# Patient Record
Sex: Male | Born: 1955 | Race: White | Hispanic: No | Marital: Married | Smoking: Current every day smoker
Health system: Southern US, Community
[De-identification: ages and names within clinical notes are randomized; demographics above are authoritative.]

## PROBLEM LIST (undated history)

## (undated) DIAGNOSIS — N200 Calculus of kidney: Secondary | ICD-10-CM

## (undated) DIAGNOSIS — M4802 Spinal stenosis, cervical region: Secondary | ICD-10-CM

## (undated) DIAGNOSIS — G629 Polyneuropathy, unspecified: Secondary | ICD-10-CM

## (undated) DIAGNOSIS — M5412 Radiculopathy, cervical region: Secondary | ICD-10-CM

## (undated) HISTORY — DX: Polyneuropathy, unspecified: G62.9

## (undated) HISTORY — PX: BACK SURGERY: SHX140

## (undated) HISTORY — DX: Radiculopathy, cervical region: M54.12

## (undated) HISTORY — DX: Calculus of kidney: N20.0

## (undated) HISTORY — DX: Spinal stenosis, cervical region: M48.02

---

## 2000-05-06 HISTORY — PX: CYSTOSCOPY/URETEROSCOPY/HOLMIUM LASER/STENT PLACEMENT: SHX6546

## 2004-12-23 ENCOUNTER — Emergency Department: Payer: Self-pay | Admitting: Unknown Physician Specialty

## 2005-01-04 ENCOUNTER — Emergency Department: Payer: Self-pay | Admitting: Internal Medicine

## 2005-02-12 ENCOUNTER — Other Ambulatory Visit: Payer: Self-pay

## 2005-02-13 ENCOUNTER — Inpatient Hospital Stay: Payer: Self-pay | Admitting: Infectious Diseases

## 2009-04-10 ENCOUNTER — Emergency Department: Payer: Self-pay | Admitting: Emergency Medicine

## 2009-09-10 ENCOUNTER — Emergency Department: Payer: Self-pay | Admitting: Emergency Medicine

## 2010-06-09 ENCOUNTER — Emergency Department: Payer: Self-pay | Admitting: Unknown Physician Specialty

## 2012-05-01 ENCOUNTER — Ambulatory Visit: Payer: Self-pay | Admitting: Family Medicine

## 2013-07-01 ENCOUNTER — Emergency Department: Payer: Self-pay | Admitting: Internal Medicine

## 2014-05-05 DIAGNOSIS — M5412 Radiculopathy, cervical region: Secondary | ICD-10-CM | POA: Insufficient documentation

## 2014-05-05 HISTORY — DX: Radiculopathy, cervical region: M54.12

## 2014-05-09 DIAGNOSIS — M4802 Spinal stenosis, cervical region: Secondary | ICD-10-CM

## 2014-05-09 HISTORY — DX: Spinal stenosis, cervical region: M48.02

## 2015-06-21 ENCOUNTER — Emergency Department: Payer: PRIVATE HEALTH INSURANCE

## 2015-06-21 ENCOUNTER — Emergency Department
Admission: EM | Admit: 2015-06-21 | Discharge: 2015-06-21 | Disposition: A | Discharge status: Home / Self Care | Payer: PRIVATE HEALTH INSURANCE | Attending: Emergency Medicine | Admitting: Emergency Medicine

## 2015-06-21 ENCOUNTER — Encounter: Payer: Self-pay | Admitting: Emergency Medicine

## 2015-06-21 DIAGNOSIS — R109 Unspecified abdominal pain: Secondary | ICD-10-CM | POA: Diagnosis present

## 2015-06-21 DIAGNOSIS — N200 Calculus of kidney: Secondary | ICD-10-CM | POA: Diagnosis not present

## 2015-06-21 DIAGNOSIS — F17210 Nicotine dependence, cigarettes, uncomplicated: Secondary | ICD-10-CM | POA: Diagnosis not present

## 2015-06-21 LAB — CBC
HCT: 42.6 % (ref 40.0–52.0)
HEMOGLOBIN: 14.7 g/dL (ref 13.0–18.0)
MCH: 30.5 pg (ref 26.0–34.0)
MCHC: 34.4 g/dL (ref 32.0–36.0)
MCV: 88.7 fL (ref 80.0–100.0)
Platelets: 161 10*3/uL (ref 150–440)
RBC: 4.8 MIL/uL (ref 4.40–5.90)
RDW: 13.6 % (ref 11.5–14.5)
WBC: 8.6 10*3/uL (ref 3.8–10.6)

## 2015-06-21 LAB — BASIC METABOLIC PANEL
ANION GAP: 6 (ref 5–15)
BUN: 22 mg/dL — ABNORMAL HIGH (ref 6–20)
CALCIUM: 9 mg/dL (ref 8.9–10.3)
CHLORIDE: 109 mmol/L (ref 101–111)
CO2: 29 mmol/L (ref 22–32)
Creatinine, Ser: 1.4 mg/dL — ABNORMAL HIGH (ref 0.61–1.24)
GFR calc non Af Amer: 54 mL/min — ABNORMAL LOW (ref >60)
Glucose, Bld: 84 mg/dL (ref 65–99)
Potassium: 4.4 mmol/L (ref 3.5–5.1)
Sodium: 144 mmol/L (ref 135–145)

## 2015-06-21 LAB — URINALYSIS COMPLETE WITH MICROSCOPIC (ARMC ONLY)
Bilirubin Urine: NEGATIVE
Glucose, UA: NEGATIVE mg/dL
Ketones, ur: NEGATIVE mg/dL
Leukocytes, UA: NEGATIVE
Nitrite: NEGATIVE
PH: 6 (ref 5.0–8.0)
Protein, ur: NEGATIVE mg/dL
Specific Gravity, Urine: 1.023 (ref 1.005–1.030)
Squamous Epithelial / LPF: NONE SEEN

## 2015-06-21 MED ORDER — OXYCODONE-ACETAMINOPHEN 5-325 MG PO TABS: 1.0000 | ORAL_TABLET | Freq: Four times a day (QID) | ORAL | Status: DC | PRN

## 2015-06-21 MED ORDER — ONDANSETRON HCL 4 MG PO TABS: 4.0000 mg | ORAL_TABLET | Freq: Every day | ORAL | Status: DC | PRN

## 2015-06-21 MED ORDER — ONDANSETRON HCL 4 MG/2ML IJ SOLN
4.0000 mg | Freq: Once | INTRAMUSCULAR | Status: AC
  Administered 2015-06-21: 4 mg via INTRAVENOUS
  Filled 2015-06-21: qty 2

## 2015-06-21 MED ORDER — KETOROLAC TROMETHAMINE 30 MG/ML IJ SOLN
INTRAMUSCULAR | Status: AC
Start: 2015-06-21 — End: 2015-06-21
  Administered 2015-06-21: 15 mg via INTRAVENOUS
  Filled 2015-06-21: qty 1

## 2015-06-21 MED ORDER — KETOROLAC TROMETHAMINE 30 MG/ML IJ SOLN
15.0000 mg | Freq: Once | INTRAMUSCULAR | Status: AC
  Administered 2015-06-21: 15 mg via INTRAVENOUS

## 2015-06-21 MED ORDER — TAMSULOSIN HCL 0.4 MG PO CAPS: 0.4000 mg | ORAL_CAPSULE | Freq: Every day | ORAL | Status: DC

## 2015-06-21 MED ORDER — OXYCODONE-ACETAMINOPHEN 5-325 MG PO TABS
1.0000 | ORAL_TABLET | Freq: Once | ORAL | Status: DC
  Filled 2015-06-21: qty 1

## 2015-06-21 MED ORDER — SODIUM CHLORIDE 0.9 % IV BOLUS (SEPSIS)
500.0000 mL | Freq: Once | INTRAVENOUS | Status: AC
  Administered 2015-06-21: 500 mL via INTRAVENOUS

## 2015-06-21 NOTE — ED Notes (Signed)
Patient presents to the ED with right flank pain x 3-4 days.  Patient states pain has been increasing over the past several days.  Patient denies dysuria.  Patient states pain is constant and feels like being punched.  Patient states nothing makes it worse or better.  Patient reports nausea but denies vomiting and denies diarrhea.  Patient reports history of kidney stones but states this feels different.  Patient reports severe pain, sitting comfortable relaxed in triage, speaking in full sentences, no obvious distress.  Ambulatory to triage.

## 2015-06-21 NOTE — ED Provider Notes (Addendum)
[facility] Emergency Department Provider Note  ____________________________________________   I have reviewed the triage vital signs and the nursing notes.   HISTORY  Chief Complaint Flank Pain    HPI Martin [name] is a 60 y.o. male with a history of kidney stones last passedwas 7 years ago, history of back surgery otherwise largely healthy gentleman presents today complaining of right flank pain which was gradual in onset of 3-4 days duration. No dysuria no hematuria. Patient states this is not like his prior kidney stones as the pain is any different spot hand feels somewhat different. He has no abdominal pain. He has not vomited. No fever. Nothing makes it better nothing makes it worse. Denies hematuria. No diarrhea or other GI complaints. No chest pain or shortness of breath  History reviewed. No pertinent past medical history.  There are no active problems to display for this patient.   Past Surgical History  Procedure Laterality Date  . Back surgery      No current outpatient prescriptions on file.  Allergies Review of patient's allergies indicates no known allergies.  No family history on file.  Social History Social History  Substance Use Topics  . Smoking status: Current Every Day Smoker -- 0.30 packs/day    Types: Cigarettes  . Smokeless tobacco: None  . Alcohol Use: Yes     Comment: rarely    Review of Systems Constitutional: No fever/chills Eyes: No visual changes. ENT: No sore throat. No stiff neck no neck pain Cardiovascular: Denies chest pain. Respiratory: Denies shortness of breath. Gastrointestinal:   no vomiting.  No diarrhea.  No constipation. Genitourinary: Negative for dysuria. Musculoskeletal: Negative lower extremity swelling Skin: Negative for rash. Neurological: Negative for headaches, focal weakness or numbness. 10-point ROS otherwise  negative.  ____________________________________________   PHYSICAL EXAM:  VITAL SIGNS: ED Triage Vitals  Enc Vitals Group     BP 06/21/15 1300 142/78 mmHg     Pulse Rate 06/21/15 1300 54     Resp 06/21/15 1300 18     Temp 06/21/15 1300 97.8 F (36.6 C)     Temp Source 06/21/15 1300 Oral     SpO2 06/21/15 1300 98 %     Weight 06/21/15 1300 195 lb (88.451 kg)     Height 06/21/15 1300 5\' 9"  (1.753 m)     Head Cir --      Peak Flow --      Pain Score 06/21/15 1301 10     Pain Loc --      Pain Edu? --      Excl. in [place]? --     Constitutional: Alert and oriented. Well appearing and in no acute distress. Eyes: Conjunctivae are normal. PERRL. EOMI. Head: Atraumatic. Nose: No congestion/rhinnorhea. Mouth/Throat: Mucous membranes are moist.  Oropharynx non-erythematous. Neck: No stridor.   Nontender with no meningismus Cardiovascular: Normal rate, regular rhythm. Grossly normal heart sounds.  Good peripheral circulation. Respiratory: Normal respiratory effort.  No retractions. Lungs CTAB. Abdominal: Soft and nontender. No distention. No guarding no rebound Back:  There is no focal tenderness or step off there is no midline tenderness there are no lesions noted. there is right CVA tenderness Musculoskeletal: No lower extremity tenderness. No joint effusions, no DVT signs strong distal pulses no edema Neurologic:  Normal speech and language. No gross focal neurologic deficits are appreciated.  Skin:  Skin is warm, dry and intact. No rash noted. Psychiatric: Mood and affect are normal. Speech and behavior are  normal.  ____________________________________________   LABS (all labs ordered are listed, but only abnormal results are displayed)  Labs Reviewed  BASIC METABOLIC PANEL - Abnormal; Notable for the following:    BUN 22 (*)    Creatinine, Ser 1.40 (*)    GFR calc non Af Amer 54 (*)    All other components within normal limits  URINALYSIS COMPLETEWITH MICROSCOPIC ([facility]  ONLY) - Abnormal; Notable for the following:    Color, Urine YELLOW (*)    APPearance CLEAR (*)    Hgb urine dipstick 2+ (*)    Bacteria, UA RARE (*)    All other components within normal limits  CBC   ____________________________________________  EKG  I personally interpreted any EKGs ordered by me or triage  ____________________________________________  RADIOLOGY  I reviewed any imaging ordered by me or triage that were performed during my shift ____________________________________________   PROCEDURES  Procedure(s) performed: None  Critical Care performed: None  ____________________________________________   INITIAL IMPRESSION / ASSESSMENT AND PLAN / ED COURSE  Pertinent labs & imaging results that were available during my care of the patient were reviewed by me and considered in my medical decision making (see chart for details).  Patient with right flank pain and hematuria most likely he has a kidney stone. However, given his age, somewhat elevated creatinine we will give him IV fluid and obtain a CT scan to rule out other entities such as AAA which I have very low suspicion.  ----------------------------------------- 4:08 PM on 06/21/2015 -----------------------------------------  There is an 8 mm kidney stone which is likely the cause of the patient's pain. We will talk to urology given the size of the stone he may have to have it broken up. ____________________________________________   FINAL CLINICAL IMPRESSION(S) / ED DIAGNOSES  Final diagnoses:  Flank pain      This chart was dictated using voice recognition software.  Despite best efforts to proofread,  errors can occur which can change meaning.    Schuyler Amor, MD 06/21/15 [place], MD 06/21/15 [phone]

## 2015-06-21 NOTE — Discharge Instructions (Signed)
You have a very large stone, it is 8 mm. It likely will not pass on its own. You must follow closely with urology. If you've suffer from a fever or pain that is out of control return to the emergency department.

## 2015-06-22 ENCOUNTER — Encounter: Payer: Self-pay | Admitting: *Deleted

## 2015-06-22 ENCOUNTER — Encounter: Payer: Self-pay | Admitting: Urology

## 2015-06-22 ENCOUNTER — Ambulatory Visit (INDEPENDENT_AMBULATORY_CARE_PROVIDER_SITE_OTHER): Payer: PRIVATE HEALTH INSURANCE | Admitting: Urology

## 2015-06-22 VITALS — BP 161/85 | HR 65 | Ht 69.0 in | Wt 198.0 lb

## 2015-06-22 DIAGNOSIS — D3502 Benign neoplasm of left adrenal gland: Secondary | ICD-10-CM | POA: Diagnosis not present

## 2015-06-22 DIAGNOSIS — N179 Acute kidney failure, unspecified: Secondary | ICD-10-CM | POA: Diagnosis not present

## 2015-06-22 DIAGNOSIS — N201 Calculus of ureter: Secondary | ICD-10-CM

## 2015-06-22 LAB — URINALYSIS, COMPLETE
BILIRUBIN UA: NEGATIVE
Glucose, UA: NEGATIVE
KETONES UA: NEGATIVE
LEUKOCYTES UA: NEGATIVE
Nitrite, UA: NEGATIVE
SPEC GRAV UA: 1.02 (ref 1.005–1.030)
Urobilinogen, Ur: 1 mg/dL (ref 0.2–1.0)
pH, UA: 6.5 (ref 5.0–7.5)

## 2015-06-22 LAB — MICROSCOPIC EXAMINATION
Bacteria, UA: NONE SEEN
Epithelial Cells (non renal): NONE SEEN /hpf (ref 0–10)
RBC, UA: >30 /hpf — ABNORMAL HIGH (ref 0–?)
WBC, UA: NONE SEEN /hpf (ref 0–?)

## 2015-06-22 MED ORDER — OXYCODONE-ACETAMINOPHEN 5-325 MG PO TABS: 1.0000 | ORAL_TABLET | ORAL | Status: DC | PRN

## 2015-06-22 MED ORDER — OXYCODONE-ACETAMINOPHEN 5-325 MG PO TABS: 1.0000 | ORAL_TABLET | Freq: Four times a day (QID) | ORAL | Status: DC | PRN

## 2015-06-22 NOTE — Progress Notes (Signed)
06/22/2015 11:02 AM   Martin [name] 02/21/1956 [id number]  Referring provider: ED   Chief Complaint  Patient presents with  . Nephrolithiasis    New Patient    HPI: 60 yo M who  Presents today for follow up from the ED yesterday found to have 8 mm right mid ureteral calculus with moderate right hydronephrosis.    He has right flank pain 2-3x.  No nausea or vomiting.  No dysuria or gross hematuria.  No fevers or chills.    He has had previous episodes of kidney stones which required surgical intervention (laser lithotripsy with ureteral stent) ~15 year ago.  Stone composition unknown.   725 HFU, 11 mm SDS distance.  Pain well controlled on oral pain meds.     PMH: Past Medical History  Diagnosis Date  . Neuropathy ([place])   . Kidney stone   . 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 Cervical spinal stenosis 05/09/2014    Surgical History: Past Surgical History  Procedure Laterality Date  . Back surgery      1994, 1996   . Cystoscopy/ureteroscopy/holmium laser/stent placement  2002    Home Medications:    Medication List       This list is accurate as of: 06/22/15 11:02 AM.  Always use your most recent med list.               gabapentin 300 MG capsule  Commonly known as:  NEURONTIN  Take 600 mg by mouth at bedtime.     ondansetron 4 MG tablet  Commonly known as:  ZOFRAN  Take 1 tablet (4 mg total) by mouth daily as needed for nausea or vomiting.     oxyCODONE-acetaminophen 5-325 MG tablet  Commonly known as:  ROXICET  Take 1 tablet by mouth every 6 (six) hours as needed.     tamsulosin 0.4 MG Caps capsule  Commonly known as:  FLOMAX  Take 1 capsule (0.4 mg total) by mouth daily.        Allergies: No Known Allergies  Family History: Family History  Problem Relation Age of Onset  . Prostate cancer Neg Hx   . Bladder Cancer Neg Hx   . Kidney cancer Neg Hx     Social History:  reports that he has been smoking Cigarettes.  He has been  smoking about 0.50 packs per day. He does not have any smokeless tobacco history on file. He reports that he drinks alcohol. He reports that he does not use illicit drugs.  ROS: UROLOGY Frequent Urination?: No Hard to postpone urination?: No Burning/pain with urination?: No Get up at night to urinate?: No Leakage of urine?: No Urine stream starts and stops?: No Trouble starting stream?: No Do you have to strain to urinate?: No Blood in urine?: Yes Urinary tract infection?: No Sexually transmitted disease?: No Injury to kidneys or bladder?: No Painful intercourse?: No Weak stream?: No Erection problems?: No Penile pain?: No  Gastrointestinal Nausea?: No Vomiting?: No Indigestion/heartburn?: No Diarrhea?: No Constipation?: No  Constitutional Fever: No Night sweats?: No Weight loss?: No Fatigue?: No  Skin Skin rash/lesions?: No Itching?: No  Eyes Blurred vision?: No Double vision?: No  Ears/Nose/Throat Sore throat?: No Sinus problems?: No  Hematologic/Lymphatic Swollen glands?: No Easy bruising?: No  Cardiovascular Leg swelling?: No Chest pain?: No  Respiratory Cough?: No Shortness of breath?: No  Endocrine Excessive thirst?: No  Musculoskeletal Back pain?: No Joint pain?: No  Neurological Headaches?: No Dizziness?: No  Psychologic Depression?:  No Anxiety?: No  Physical Exam: BP 161/85 mmHg  Pulse 65  Ht '5\' 9"'  (1.753 m)  Wt 198 lb (89.812 kg)  BMI 29.23 kg/m2  Constitutional:  Alert and oriented, No acute distress. HEENT: [place] AT, moist mucus membranes.  Trachea midline, no masses. Cardiovascular: No clubbing, cyanosis, or edema. Respiratory: Normal respiratory effort, no increased work of breathing. GI: Abdomen is soft, nontender, nondistended, no abdominal masses GU: No CVA tenderness.  Skin: No rashes, bruises or suspicious lesions. Lymph: No cervical or inguinal adenopathy. Neurologic: Grossly intact, no focal deficits, moving all  4 extremities. Psychiatric: Normal mood and affect.  Laboratory Data: Lab Results  Component Value Date   WBC 8.6 06/21/2015   HGB 14.7 06/21/2015   HCT 42.6 06/21/2015   MCV 88.7 06/21/2015   PLT 161 06/21/2015    Lab Results  Component Value Date   CREATININE 1.40* 06/21/2015    Urinalysis    Component Value Date/Time   COLORURINE YELLOW* 06/21/2015 1304   APPEARANCEUR CLEAR* 06/21/2015 1304   LABSPEC 1.023 06/21/2015 1304   PHURINE 6.0 06/21/2015 1304   GLUCOSEU NEGATIVE 06/21/2015 1304   HGBUR 2+* 06/21/2015 1304   BILIRUBINUR NEGATIVE 06/21/2015 1304   KETONESUR NEGATIVE 06/21/2015 1304   PROTEINUR NEGATIVE 06/21/2015 1304   NITRITE NEGATIVE 06/21/2015 1304   LEUKOCYTESUR NEGATIVE 06/21/2015 1304    Pertinent Imaging: Study Result     CLINICAL DATA: Patient presents to the ED with right flank pain x 3-4 days. Patient states pain has been increasing over the past several days. Patient denies dysuria. Patient states pain is constant and feels like being punched. Patient states nothing makes it worse or better. Patient reports nausea but denies vomiting and denies diarrhea. Patient reports history of kidney stones but states this feels different. right flank pain  EXAM: CT ABDOMEN AND PELVIS WITHOUT CONTRAST  TECHNIQUE: Multidetector CT imaging of the abdomen and pelvis was performed following the standard protocol without IV contrast.  COMPARISON: 12/23/2004  FINDINGS: Lung bases: Clear. Heart normal size.  Liver: Fatty infiltration. No mass or focal lesion.  Gallbladder and biliary tree: Small dependent gallstone. Gallbladder otherwise unremarkable. No bile duct dilation.  Spleen, pancreas: Normal.  Adrenal glands: 17 mm left adrenal mass, without change from the prior exam consistent with an adenoma. Normal right adrenal gland.  Kidneys, ureters, bladder: Moderate right hydronephrosis. The proximal ureter is dilated to where it  intersects an 8 mm stone, between the proximal and mid right ureter. Ureter below this is normal in caliber. Right kidney is mildly edematous with mild perinephric stranding. Left renal collecting system ureter are normal. No renal masses. Bladder is decompressed.  Lymph nodes: No adenopathy.  Ascites: None.  Gastrointestinal: Numerous colonic diverticula mostly along the sigmoid colon. No diverticulitis. Stomach and small bowel are unremarkable. Normal appendix visualized.  Musculoskeletal: Degenerative changes are noted most evident at L5-S1. No osteoblastic or osteolytic lesions.  IMPRESSION: 1. 8 mm stone in the proximal to mid right ureter causes moderate right hydronephrosis and mild right renal edema and perinephric stranding. No intrarenal stones. No other ureteral stones. 2. No other acute findings. 3. Hepatic steatosis. Left adrenal adenoma. Colonic diverticulosis.   Electronically Signed  By: Lajean Manes M.D.  On: 06/21/2015 15:43      Assessment & Plan:   1. Right ureteral stone 72m prox/ mid ureteral stone.  No evidence of infection.  Discussed MET vs. Surgical intervention.  Given the size of the stone, he does have a chance of  passing the stone spontaneously, although relatively low.  We discussed various treatment options including ESWL vs. ureteroscopy, laser lithotripsy, and stent. We discussed the risks and benefits of both including bleeding, infection, damage to surrounding structures, efficacy with need for possible further intervention, and need for temporary ureteral stent.  After considering his options, he like to proceed with right ESWL.  Preprocedure instructions reviewed and consent signed today.  - Urinalysis, Complete  2. Acute kidney injury ([place]) Cr 1.4, baseline unknown Will recheck post op  3. 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

## 2015-06-24 LAB — CULTURE, URINE COMPREHENSIVE

## 2015-06-29 ENCOUNTER — Encounter
Admission: RE | Disposition: A | Discharge status: Home / Self Care | Payer: Self-pay | Source: Ambulatory Visit | Attending: Urology

## 2015-06-29 ENCOUNTER — Ambulatory Visit
Admission: RE | Admit: 2015-06-29 | Discharge: 2015-06-29 | Disposition: A | Discharge status: Home / Self Care | Payer: PRIVATE HEALTH INSURANCE | Source: Ambulatory Visit | Attending: Urology | Admitting: Urology

## 2015-06-29 ENCOUNTER — Encounter: Payer: Self-pay | Admitting: *Deleted

## 2015-06-29 ENCOUNTER — Ambulatory Visit: Payer: PRIVATE HEALTH INSURANCE

## 2015-06-29 DIAGNOSIS — N201 Calculus of ureter: Secondary | ICD-10-CM | POA: Insufficient documentation

## 2015-06-29 DIAGNOSIS — F17200 Nicotine dependence, unspecified, uncomplicated: Secondary | ICD-10-CM | POA: Diagnosis not present

## 2015-06-29 DIAGNOSIS — Z79899 Other long term (current) drug therapy: Secondary | ICD-10-CM | POA: Diagnosis not present

## 2015-06-29 DIAGNOSIS — N200 Calculus of kidney: Secondary | ICD-10-CM

## 2015-06-29 HISTORY — PX: EXTRACORPOREAL SHOCK WAVE LITHOTRIPSY: SHX1557

## 2015-06-29 SURGERY — LITHOTRIPSY, ESWL
Anesthesia: Moderate Sedation | Laterality: Right

## 2015-06-29 MED ORDER — DIAZEPAM 5 MG PO TABS
10.0000 mg | ORAL_TABLET | ORAL | Status: AC
  Administered 2015-06-29: 10 mg via ORAL

## 2015-06-29 MED ORDER — DIPHENHYDRAMINE HCL 25 MG PO CAPS
ORAL_CAPSULE | ORAL | Status: AC
  Filled 2015-06-29: qty 1

## 2015-06-29 MED ORDER — OXYCODONE-ACETAMINOPHEN 5-325 MG PO TABS
1.0000 | ORAL_TABLET | ORAL | Status: DC | PRN
  Administered 2015-06-29: 1 via ORAL

## 2015-06-29 MED ORDER — DIPHENHYDRAMINE HCL 25 MG PO CAPS
25.0000 mg | ORAL_CAPSULE | ORAL | Status: AC
  Administered 2015-06-29: 25 mg via ORAL

## 2015-06-29 MED ORDER — DIAZEPAM 5 MG PO TABS
ORAL_TABLET | ORAL | Status: AC
  Filled 2015-06-29: qty 2

## 2015-06-29 MED ORDER — CIPROFLOXACIN HCL 500 MG PO TABS
ORAL_TABLET | ORAL | Status: AC
  Filled 2015-06-29: qty 1

## 2015-06-29 MED ORDER — CIPROFLOXACIN HCL 500 MG PO TABS
500.0000 mg | ORAL_TABLET | Freq: Once | ORAL | Status: AC
  Administered 2015-06-29: 500 mg via ORAL

## 2015-06-29 MED ORDER — OXYCODONE-ACETAMINOPHEN 5-325 MG PO TABS
ORAL_TABLET | ORAL | Status: DC
Start: 2015-06-29 — End: 2015-06-29
  Filled 2015-06-29: qty 1

## 2015-06-29 MED ORDER — DEXTROSE-NACL 5-0.45 % IV SOLN
INTRAVENOUS | Status: DC
  Administered 2015-06-29: 13:00:00 via INTRAVENOUS

## 2015-06-29 NOTE — Discharge Instructions (Signed)

## 2015-06-30 ENCOUNTER — Other Ambulatory Visit: Payer: Self-pay | Admitting: Urology

## 2015-06-30 ENCOUNTER — Encounter: Payer: Self-pay | Admitting: Urology

## 2015-06-30 DIAGNOSIS — N200 Calculus of kidney: Secondary | ICD-10-CM

## 2015-07-07 ENCOUNTER — Encounter: Payer: Self-pay | Admitting: Urology

## 2015-07-07 ENCOUNTER — Ambulatory Visit (INDEPENDENT_AMBULATORY_CARE_PROVIDER_SITE_OTHER): Payer: PRIVATE HEALTH INSURANCE | Admitting: Urology

## 2015-07-07 ENCOUNTER — Ambulatory Visit
Admission: RE | Admit: 2015-07-07 | Discharge: 2015-07-07 | Disposition: A | Discharge status: Home / Self Care | Payer: PRIVATE HEALTH INSURANCE | Source: Ambulatory Visit | Attending: Urology | Admitting: Urology

## 2015-07-07 VITALS — BP 119/74 | HR 72 | Ht 69.0 in | Wt 196.2 lb

## 2015-07-07 DIAGNOSIS — N200 Calculus of kidney: Secondary | ICD-10-CM | POA: Diagnosis not present

## 2015-07-07 LAB — MICROSCOPIC EXAMINATION
Bacteria, UA: NONE SEEN
Epithelial Cells (non renal): NONE SEEN /hpf (ref 0–10)
WBC UA: NONE SEEN /HPF (ref 0–?)

## 2015-07-07 LAB — URINALYSIS, COMPLETE
BILIRUBIN UA: NEGATIVE
GLUCOSE, UA: NEGATIVE
KETONES UA: NEGATIVE
LEUKOCYTES UA: NEGATIVE
Nitrite, UA: NEGATIVE
PROTEIN UA: NEGATIVE
UUROB: 1 mg/dL (ref 0.2–1.0)
pH, UA: 5.5 (ref 5.0–7.5)

## 2015-07-07 NOTE — Progress Notes (Addendum)
07/07/2015 10:49 AM   Martin [name] 06/02/1955 [id number]  Referring provider: No referring provider defined for this encounter.  Chief Complaint  Patient presents with  . Follow-up    lithotripsy    HPI: 60 yo M who Presents today for follow up from the ED yesterday found to have 8 mm right mid ureteral calculus with moderate right hydronephrosis.   He has right flank pain 2-3x. No nausea or vomiting. No dysuria or gross hematuria. No fevers or chills.   He has had previous episodes of kidney stones which required surgical intervention (laser lithotripsy with ureteral stent) ~15 year ago. Stone composition unknown.   725 HFU, 11 mm SDS distance.  Pain well controlled on oral pain meds.   March 2017 interval history: The patient underwent ESWL of his right mid ureteral calculus. He has a negative KUB at this time. His right flank pain that he experienced preoperatively has completely resolved now. He's had no problems since surgery. He has not seen any large stone fragments in his urine.   PMH: Past Medical History  Diagnosis Date  . Neuropathy ([place])   . Kidney stone   . 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 Cervical spinal stenosis 05/09/2014    Surgical History: Past Surgical History  Procedure Laterality Date  . Back surgery      1994, 1996   . Cystoscopy/ureteroscopy/holmium laser/stent placement  2002  . Extracorporeal shock wave lithotripsy Right 06/29/2015    Procedure: EXTRACORPOREAL SHOCK WAVE LITHOTRIPSY (ESWL);  Surgeon: Nickie Retort, MD;  Location: [facility] ORS;  Service: Urology;  Laterality: Right;    Home Medications:    Medication List       This list is accurate as of: 07/07/15 10:49 AM.  Always use your most recent med list.               gabapentin 300 MG capsule  Commonly known as:  NEURONTIN  Take 600 mg by mouth at bedtime.     ondansetron 4 MG tablet  Commonly known as:  ZOFRAN  Take 1 tablet (4 mg total) by mouth  daily as needed for nausea or vomiting.     oxyCODONE-acetaminophen 5-325 MG tablet  Commonly known as:  PERCOCET  Take 1-2 tablets by mouth every 4 (four) hours as needed for moderate pain or severe pain.     oxyCODONE-acetaminophen 5-325 MG tablet  Commonly known as:  ROXICET  Take 1 tablet by mouth every 6 (six) hours as needed.     tamsulosin 0.4 MG Caps capsule  Commonly known as:  FLOMAX  Take 1 capsule (0.4 mg total) by mouth daily.        Allergies: No Known Allergies  Family History: Family History  Problem Relation Age of Onset  . Prostate cancer Neg Hx   . Bladder Cancer Neg Hx   . Kidney cancer Neg Hx     Social History:  reports that he has been smoking Cigarettes.  He has been smoking about 0.50 packs per day. He does not have any smokeless tobacco history on file. He reports that he drinks alcohol. He reports that he does not use illicit drugs.  ROS: UROLOGY Frequent Urination?: No Hard to postpone urination?: No Burning/pain with urination?: No Get up at night to urinate?: No Leakage of urine?: No Urine stream starts and stops?: No Trouble starting stream?: No Do you have to strain to urinate?: No Blood in urine?: No Urinary tract infection?: No Sexually transmitted  disease?: No Injury to kidneys or bladder?: No Painful intercourse?: No Weak stream?: No Erection problems?: No Penile pain?: No  Gastrointestinal Nausea?: No Vomiting?: No Indigestion/heartburn?: No Diarrhea?: No Constipation?: No  Constitutional Fever: No Night sweats?: No Fatigue?: No  Skin Skin rash/lesions?: No Itching?: No  Eyes Blurred vision?: No Double vision?: No  Ears/Nose/Throat Sore throat?: No Sinus problems?: No  Hematologic/Lymphatic Swollen glands?: No Easy bruising?: No  Cardiovascular Leg swelling?: No Chest pain?: No  Respiratory Cough?: No Shortness of breath?: No  Endocrine Excessive thirst?: No  Musculoskeletal Back pain?:  No Joint pain?: No  Neurological Headaches?: No Dizziness?: No  Psychologic Depression?: No Anxiety?: No  Physical Exam: BP 119/74 mmHg  Pulse 72  Ht 5\' 9"  (1.753 m)  Wt 196 lb 3.2 oz (88.996 kg)  BMI 28.96 kg/m2  Constitutional:  Alert and oriented, No acute distress. HEENT: [place] AT, moist mucus membranes.  Trachea midline, no masses. Cardiovascular: No clubbing, cyanosis, or edema. Respiratory: Normal respiratory effort, no increased work of breathing. GI: Abdomen is soft, nontender, nondistended, no abdominal masses GU: No CVA tenderness.  Skin: No rashes, bruises or suspicious lesions. Lymph: No cervical or inguinal adenopathy. Neurologic: Grossly intact, no focal deficits, moving all 4 extremities. Psychiatric: Normal mood and affect.  Laboratory Data: Lab Results  Component Value Date   WBC 8.6 06/21/2015   HGB 14.7 06/21/2015   HCT 42.6 06/21/2015   MCV 88.7 06/21/2015   PLT 161 06/21/2015    Lab Results  Component Value Date   CREATININE 1.40* 06/21/2015    No results found for: PSA  No results found for: TESTOSTERONE  No results found for: HGBA1C  Urinalysis    Component Value Date/Time   COLORURINE YELLOW* 06/21/2015 1304   APPEARANCEUR CLEAR* 06/21/2015 1304   LABSPEC 1.023 06/21/2015 1304   PHURINE 6.0 06/21/2015 1304   GLUCOSEU Negative 06/22/2015 1050   HGBUR 2+* 06/21/2015 1304   BILIRUBINUR Negative 06/22/2015 1050   BILIRUBINUR NEGATIVE 06/21/2015 1304   KETONESUR NEGATIVE 06/21/2015 1304   PROTEINUR NEGATIVE 06/21/2015 1304   NITRITE Negative 06/22/2015 1050   NITRITE NEGATIVE 06/21/2015 1304   LEUKOCYTESUR Negative 06/22/2015 1050   LEUKOCYTESUR NEGATIVE 06/21/2015 1304    Pertinent Imaging: CLINICAL DATA: Nephrolithiasis.  EXAM: ABDOMEN - 1 VIEW  COMPARISON: 06/29/2015. CT 06/21/2015.  FINDINGS: Previously seen calcification adjacent to the L4 spinous process on the right is not definitively seen currently. Small  calcification projecting over the right upper quadrant likely reflects the gallstones seen on prior CT. No definite calcifications over the kidneys. Nonobstructive bowel gas pattern. No organomegaly.  IMPRESSION: No definite urinary tract calculi seen currently.   Assessment & Plan:    1. Right ureteral stone -Status post ESWL. No residual stone visible on KUB. Resolved  2. Acute kidney injury ([place]) Cr 1.4 preop, baseline unknown -ensure normalization with BMP today  Return if symptoms worsen or fail to improve.  Nickie Retort, MD  [facility] [address], [place], [place] [address] [phone]

## 2015-07-08 LAB — BASIC METABOLIC PANEL
BUN / CREAT RATIO: 14 (ref 9–20)
BUN: 14 mg/dL (ref 6–24)
CO2: 25 mmol/L (ref 18–29)
CREATININE: 1.02 mg/dL (ref 0.76–1.27)
Calcium: 9.2 mg/dL (ref 8.7–10.2)
Chloride: 106 mmol/L (ref 96–106)
GFR, EST AFRICAN AMERICAN: 94 mL/min/{1.73_m2} (ref >59)
GFR, EST NON AFRICAN AMERICAN: 81 mL/min/{1.73_m2} (ref >59)
Glucose: 97 mg/dL (ref 65–99)
Potassium: 4.5 mmol/L (ref 3.5–5.2)
SODIUM: 145 mmol/L — AB (ref 134–144)

## 2015-08-19 ENCOUNTER — Encounter: Payer: Self-pay | Admitting: Emergency Medicine

## 2015-08-19 ENCOUNTER — Emergency Department
Admission: EM | Admit: 2015-08-19 | Discharge: 2015-08-19 | Disposition: A | Discharge status: Home / Self Care | Payer: PRIVATE HEALTH INSURANCE | Attending: Emergency Medicine | Admitting: Emergency Medicine

## 2015-08-19 DIAGNOSIS — Z87442 Personal history of urinary calculi: Secondary | ICD-10-CM | POA: Diagnosis not present

## 2015-08-19 DIAGNOSIS — G629 Polyneuropathy, unspecified: Secondary | ICD-10-CM | POA: Insufficient documentation

## 2015-08-19 DIAGNOSIS — F17210 Nicotine dependence, cigarettes, uncomplicated: Secondary | ICD-10-CM | POA: Diagnosis not present

## 2015-08-19 DIAGNOSIS — M4802 Spinal stenosis, cervical region: Secondary | ICD-10-CM | POA: Insufficient documentation

## 2015-08-19 DIAGNOSIS — R300 Dysuria: Secondary | ICD-10-CM | POA: Diagnosis present

## 2015-08-19 DIAGNOSIS — R319 Hematuria, unspecified: Secondary | ICD-10-CM | POA: Insufficient documentation

## 2015-08-19 LAB — URINALYSIS COMPLETE WITH MICROSCOPIC (ARMC ONLY)
BACTERIA UA: NONE SEEN
Bilirubin Urine: NEGATIVE
GLUCOSE, UA: NEGATIVE mg/dL
Ketones, ur: NEGATIVE mg/dL
LEUKOCYTES UA: NEGATIVE
Nitrite: NEGATIVE
PROTEIN: 30 mg/dL — AB
Specific Gravity, Urine: 1.025 (ref 1.005–1.030)
pH: 5 (ref 5.0–8.0)

## 2015-08-19 MED ORDER — CEFUROXIME AXETIL 500 MG PO TABS: 500.0000 mg | ORAL_TABLET | Freq: Two times a day (BID) | ORAL | Status: AC

## 2015-08-19 NOTE — ED Notes (Signed)
Discussed discharge instructions, prescriptions, and follow-up care with patient. No questions or concerns at this time. Pt stable at discharge.  

## 2015-08-19 NOTE — ED Notes (Signed)
Bladder scan performed. Results were less than 25 mls.

## 2015-08-19 NOTE — ED Provider Notes (Signed)
[facility] Emergency Department Provider Note  ____________________________________________  Time seen: Approximately 5:37 PM  I have reviewed the triage vital signs and the nursing notes.   HISTORY  Chief Complaint Dysuria    HPI Martin [name] is a 60 y.o. male who presents with 3-4 days of difficulty emptying his bladder. He has a history of kidney stones. He has noticed some blood in his urine. He is not having back pain or groin pain. No nausea, vomiting, fevers or chills. No constipation. He has an seen by urology for the last 2-3 months. He was taking Flomax, but discontinued it a month or so ago.   Past Medical History  Diagnosis Date  . Neuropathy ([employer])   . Kidney stone   . 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 Cervical spinal stenosis 05/09/2014    Patient Active Problem List   Diagnosis Date Noted  . Cervical spinal stenosis 05/09/2014  . 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 Back surgery      1994, 1996   . Cystoscopy/ureteroscopy/holmium laser/stent placement  2002  . Extracorporeal shock wave lithotripsy Right 06/29/2015    Procedure: EXTRACORPOREAL SHOCK WAVE LITHOTRIPSY (ESWL);  Surgeon: Nickie Retort, MD;  Location: [facility] ORS;  Service: Urology;  Laterality: Right;    Current Outpatient Rx  Name  Route  Sig  Dispense  Refill  . cefUROXime (CEFTIN) 500 MG tablet   Oral   Take 1 tablet (500 mg total) by mouth 2 (two) times daily.   20 tablet   0   . gabapentin (NEURONTIN) 300 MG capsule   Oral   Take 600 mg by mouth at bedtime.      11   . ondansetron (ZOFRAN) 4 MG tablet   Oral   Take 1 tablet (4 mg total) by mouth daily as needed for nausea or vomiting. Patient not taking: Reported on 07/07/2015   10 tablet   0   . oxyCODONE-acetaminophen (PERCOCET) 5-325 MG tablet   Oral   Take 1-2 tablets by mouth every 4 (four) hours as needed for moderate pain or  severe pain. Patient not taking: Reported on 07/07/2015   30 tablet   0   . oxyCODONE-acetaminophen (ROXICET) 5-325 MG tablet   Oral   Take 1 tablet by mouth every 6 (six) hours as needed. Patient not taking: Reported on 07/07/2015   14 tablet   0   . tamsulosin (FLOMAX) 0.4 MG CAPS capsule   Oral   Take 1 capsule (0.4 mg total) by mouth daily.   10 capsule   0     Allergies Review of patient's allergies indicates no known allergies.  Family History  Problem Relation Age of Onset  . Prostate cancer Neg Hx   . Bladder Cancer Neg Hx   . Kidney cancer Neg Hx     Social History Social History  Substance Use Topics  . Smoking status: Current Every Day Smoker -- 0.50 packs/day    Types: Cigarettes  . Smokeless tobacco: None  . Alcohol Use: 0.0 oz/week    0 Standard drinks or equivalent per week     Comment: rarely    Review of Systems Constitutional: No fever/chills Eyes: No visual changes. ENT: No sore throat. Cardiovascular: Denies chest pain. Respiratory: Denies shortness of breath. Gastrointestinal: No abdominal pain.  No nausea, no vomiting.  No diarrhea.  No constipation. Genitourinary: Per history of present illness Musculoskeletal:  Negative for back pain. Skin: Negative for rash. Neurological: Negative for headaches, focal weakness or numbness. 10-point ROS otherwise negative.  ____________________________________________   PHYSICAL EXAM:  VITAL SIGNS: ED Triage Vitals  Enc Vitals Group     BP 08/19/15 1551 130/80 mmHg     Pulse Rate 08/19/15 1551 84     Resp 08/19/15 1551 18     Temp 08/19/15 1551 98.6 F (37 C)     Temp src --      SpO2 08/19/15 1551 98 %     Weight 08/19/15 1551 192 lb (87.091 kg)     Height 08/19/15 1551 5\' 9"  (1.753 m)     Head Cir --      Peak Flow --      Pain Score --      Pain Loc --      Pain Edu? --      Excl. in [place]? --     Constitutional: Alert and oriented. Well appearing and in no acute distress. Eyes:  Conjunctivae are normal. EOMI. Cardiovascular: Normal rate, regular rhythm. Grossly normal heart sounds.  Good peripheral circulation. Respiratory: Normal respiratory effort.  No retractions. Lungs CTAB. Musculoskeletal: Nml ROM of upper and lower extremity joints. Neurologic:  Normal speech and language. No gross focal neurologic deficits are appreciated. No gait instability. Skin:  Skin is warm, dry and intact. No rash noted. Psychiatric: Mood and affect are normal. Speech and behavior are normal.  ____________________________________________   LABS (all labs ordered are listed, but only abnormal results are displayed)  Labs Reviewed  URINALYSIS COMPLETEWITH MICROSCOPIC ([place]) - Abnormal; Notable for the following:    Color, Urine YELLOW (*)    APPearance CLEAR (*)    Hgb urine dipstick 3+ (*)    Protein, ur 30 (*)    Squamous Epithelial / LPF 0-5 (*)    All other components within normal limits   ____________________________________________  EKG    ____________________________________________  RADIOLOGY    ____________________________________________   PROCEDURES  Procedure(s) performed: None  Critical Care performed: No  ____________________________________________   INITIAL IMPRESSION / ASSESSMENT AND PLAN / ED COURSE  Pertinent labs & imaging results that were available during my care of the patient were reviewed by me and considered in my medical decision making (see chart for details).  60 year old with 3-4 days of difficulty emptying bladder, without pain or dysuria. Urinalysis reveals hematuria. He was seen by urology earlier this year for kidney stones. Once his symptoms stopped, he also stopped Flomax. Offered a CT abdomen and pelvis but patient declines, and I think this is reasonable. He can follow-up with his primary physician next week or call the urologist for a follow-up appointment next week. He will restart Flomax, and I also gave him  Ceftin 500 mg twice a day. Instructed in returning to the emergency room for inability to urinate, or any other concerns. ____________________________________________   FINAL CLINICAL IMPRESSION(S) / ED DIAGNOSES  Final diagnoses:  Hematuria  Personal history of kidney stones      Mortimer Fries, PA-C 08/19/15 1742  Lisa Roca, MD 08/21/15 1155

## 2015-08-19 NOTE — ED Notes (Signed)
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

## 2015-08-19 NOTE — Discharge Instructions (Signed)
Hematuria, Adult Hematuria is blood in your urine. It can be caused by a bladder infection, kidney infection, prostate infection, kidney stone, or cancer of your urinary tract. Infections can usually be treated with medicine, and a kidney stone usually will pass through your urine. If neither of these is the cause of your hematuria, further workup to find out the reason may be needed. It is very important that you tell your health care provider about any blood you see in your urine, even if the blood stops without treatment or happens without causing pain. Blood in your urine that happens and then stops and then happens again can be a symptom of a very serious condition. Also, pain is not a symptom in the initial stages of many urinary cancers. HOME CARE INSTRUCTIONS   Drink lots of fluid, 3-4 quarts a day. If you have been diagnosed with an infection, cranberry juice is especially recommended, in addition to large amounts of water.  Avoid caffeine, tea, and carbonated beverages because they tend to irritate the bladder.  Avoid alcohol because it may irritate the prostate.  Take all medicines as directed by your health care provider.  If you were prescribed an antibiotic medicine, finish it all even if you start to feel better.  If you have been diagnosed with a kidney stone, follow your health care provider's instructions regarding straining your urine to catch the stone.  Empty your bladder often. Avoid holding urine for long periods of time.  After a bowel movement, women should cleanse front to back. Use each tissue only once.  Empty your bladder before and after sexual intercourse if you are a male. SEEK MEDICAL CARE IF:  You develop back pain.  You have a fever.  You have a feeling of sickness in your stomach (nausea) or vomiting.  Your symptoms are not better in 3 days. Return sooner if you are getting worse. SEEK IMMEDIATE MEDICAL CARE IF:   You develop severe vomiting and  are unable to keep the medicine down.  You develop severe back or abdominal pain despite taking your medicines.  You begin passing a large amount of blood or clots in your urine.  You feel extremely weak or faint, or you pass out. MAKE SURE YOU:   Understand these instructions.  Will watch your condition.  Will get help right away if you are not doing well or get worse.   This information is not intended to replace advice given to you by your health care provider. Make sure you discuss any questions you have with your health care provider.   Document Released: 04/22/2005 Document Revised: 05/13/2014 Document Reviewed: 12/21/2012 Elsevier Interactive Patient Education 2016 [employer].  Take Flomax daily, or twice a day. Follow-up with your primary physician early next week or contact your urologist. If unable to urinate he should return to the emergency room for further evaluation.

## 2016-05-27 ENCOUNTER — Encounter: Payer: Self-pay | Admitting: Emergency Medicine

## 2016-05-27 ENCOUNTER — Emergency Department
Admission: EM | Admit: 2016-05-27 | Discharge: 2016-05-27 | Disposition: A | Discharge status: Home / Self Care | Payer: PRIVATE HEALTH INSURANCE | Attending: Emergency Medicine | Admitting: Emergency Medicine

## 2016-05-27 ENCOUNTER — Emergency Department: Payer: PRIVATE HEALTH INSURANCE

## 2016-05-27 DIAGNOSIS — W1809XA Striking against other object with subsequent fall, initial encounter: Secondary | ICD-10-CM | POA: Insufficient documentation

## 2016-05-27 DIAGNOSIS — S161XXA Strain of muscle, fascia and tendon at neck level, initial encounter: Secondary | ICD-10-CM | POA: Diagnosis not present

## 2016-05-27 DIAGNOSIS — Z79899 Other long term (current) drug therapy: Secondary | ICD-10-CM | POA: Insufficient documentation

## 2016-05-27 DIAGNOSIS — Y929 Unspecified place or not applicable: Secondary | ICD-10-CM | POA: Diagnosis not present

## 2016-05-27 DIAGNOSIS — Y939 Activity, unspecified: Secondary | ICD-10-CM | POA: Diagnosis not present

## 2016-05-27 DIAGNOSIS — S199XXA Unspecified injury of neck, initial encounter: Secondary | ICD-10-CM | POA: Diagnosis present

## 2016-05-27 DIAGNOSIS — Y999 Unspecified external cause status: Secondary | ICD-10-CM | POA: Insufficient documentation

## 2016-05-27 DIAGNOSIS — F17210 Nicotine dependence, cigarettes, uncomplicated: Secondary | ICD-10-CM | POA: Diagnosis not present

## 2016-05-27 DIAGNOSIS — T148XXA Other injury of unspecified body region, initial encounter: Secondary | ICD-10-CM

## 2016-05-27 MED ORDER — MELOXICAM 15 MG PO TABS: 15.0000 mg | ORAL_TABLET | Freq: Every day | ORAL | 0 refills | Status: AC

## 2016-05-27 NOTE — ED Triage Notes (Addendum)
Pt presents with headache and neck pain after falling and hitting his head on the ice this past Saturday. . Pt denies any loc at time of fall.  Pt states to this writer that he does not want anything ordered until he sees a provider.

## 2016-05-27 NOTE — ED Provider Notes (Signed)
[facility] Emergency Department Provider Note  ____________________________________________  Time seen: Approximately 11:42 AM  I have reviewed the triage vital signs and the nursing notes.   HISTORY  Chief Complaint Torticollis    HPI Martin [name] is a 61 y.o. male that presents to emergency department with neck pain after falling and hitting head on Saturday morning. He states that he continues to have pain on the sides of his neck. Patient denies loss of consciousness. Patient has not taken anything for pain. Patient reports baseline level of activity and no confusion. Patient is not on any blood thinners. No fever, headache, visual changes, cough, shortness of breath, chest pain, nausea, vomiting, abdominal pain.   Past Medical History:  Diagnosis Date  . 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 Cervical spinal stenosis 05/09/2014  . Kidney stone   . Neuropathy [facility])     Patient Active Problem List   Diagnosis Date Noted  . Cervical spinal stenosis 05/09/2014  . 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 [place]   . CYSTOSCOPY/URETEROSCOPY/HOLMIUM LASER/STENT PLACEMENT  2002  . EXTRACORPOREAL SHOCK WAVE LITHOTRIPSY Right 06/29/2015   Procedure: EXTRACORPOREAL SHOCK WAVE LITHOTRIPSY (ESWL);  Surgeon: Nickie Retort, MD;  Location: [facility] ORS;  Service: Urology;  Laterality: Right;    Prior to Admission medications   Medication Sig Start Date End Date Taking? Authorizing Provider  gabapentin (NEURONTIN) 300 MG capsule Take 600 mg by mouth at bedtime. 06/09/15   Historical Provider, MD  meloxicam (MOBIC) 15 MG tablet Take 1 tablet (15 mg total) by mouth daily. 05/27/16 06/06/16  Laban Emperor, PA-C    Allergies Patient has no known allergies.  Family History  Problem Relation Age of Onset  . Prostate cancer Neg Hx   . Bladder Cancer Neg Hx   . Kidney cancer Neg Hx      Social History Social History  Substance Use Topics  . Smoking status: Current Every Day Smoker    Packs/day: 0.50    Types: Cigarettes  . Smokeless tobacco: Never Used  . Alcohol use 0.0 oz/week     Comment: rarely     Review of Systems  Constitutional: No fever/chills ENT: No upper respiratory complaints. Cardiovascular: No chest pain. Respiratory: No cough. No SOB. Gastrointestinal: No abdominal pain.  No nausea, no vomiting.  Skin: Negative for rash, abrasions, lacerations, ecchymosis. Neurological: Negative for headaches, numbness or tingling   ____________________________________________   PHYSICAL EXAM:  VITAL SIGNS: ED Triage Vitals  Enc Vitals Group     BP 05/27/16 1015 135/78     Pulse Rate 05/27/16 1015 (!) 57     Resp 05/27/16 1015 15     Temp 05/27/16 1015 98.2 F (36.8 C)     Temp Source 05/27/16 1015 Oral     SpO2 05/27/16 1015 97 %     Weight 05/27/16 1015 190 lb (86.2 kg)     Height 05/27/16 1015 5\' 9"  (1.753 m)     Head Circumference --      Peak Flow --      Pain Score 05/27/16 1018 8     Pain Loc --      Pain Edu? --      Excl. in [place]? --      Constitutional: Alert and oriented. Well appearing and in no acute distress. Eyes: Conjunctivae are normal. PERRL. EOMI. Head: Atraumatic. ENT:  Ears:      Nose: No congestion/rhinnorhea.      Mouth/Throat: Mucous membranes are moist.  Neck: No stridor. Range of motion. Strength 5 out of 5. No cervical spine tenderness to palpation.Tenderness to palpation over trapezius muscle, primarily on right side. Cardiovascular: Normal rate, regular rhythm. Normal S1 and S2.  Good peripheral circulation. Respiratory: Normal respiratory effort without tachypnea or retractions. Lungs CTAB. Good air entry to the bases with no decreased or absent breath sounds. Musculoskeletal: Full range of motion to all extremities. No gross deformities appreciated. Neurologic:  Normal speech and language. No gross  focal neurologic deficits are appreciated.  Cranial nerves: 2-10 normal as tested. Strength 5/5 in upper and lower extremities Cerebellar: Finger-nose-finger WNL, Heel to shin WNL Sensorimotor: No pronator drift, clonus, sensory loss or abnormal reflexes. No vision deficits noted to confrontation bilaterally.  Speech: No dysarthria or expressive aphasia Skin:  Skin is warm, dry and intact. No rash noted. Psychiatric: Mood and affect are normal. Speech and behavior are normal. Patient exhibits appropriate insight and judgement.   ____________________________________________   LABS (all labs ordered are listed, but only abnormal results are displayed)  Labs Reviewed - No data to display ____________________________________________  EKG   ____________________________________________  RADIOLOGY Robinette Haines, personally viewed and evaluated these images (plain radiographs) as part of my medical decision making, as well as reviewing the written report by the radiologist. Dg Cervical Spine Complete  Result Date: 05/27/2016 CLINICAL DATA:  Fall. EXAM: CERVICAL SPINE - COMPLETE 4+ VIEW COMPARISON:  MRI cervical spine 05/01/2012 . FINDINGS: Diffuse degenerative change cervical spine. No acute bony abnormality identified. No evidence of fracture dislocation. Ligamentous ossification. IMPRESSION: Diffuse degenerative change cervical spine.  No acute abnormality. Electronically Signed   By: Marcello Moores  Register   On: 05/27/2016 11:49    ____________________________________________    PROCEDURES  Procedure(s) performed:    Procedures    Medications - No data to display   ____________________________________________   INITIAL IMPRESSION / ASSESSMENT AND PLAN / ED COURSE  Pertinent labs & imaging results that were available during my care of the patient were reviewed by me and considered in my medical decision making (see chart for details).  Review of the [place] CSRS was performed in  accordance of the [place] prior to dispensing any controlled drugs.     Patient's diagnosis is consistent with muscle strain. Vital signs and exam are reassuring. Neuro exam within normal limits. She is not on any blood thinners. Cervical spine x-ray does not show evidence of any acute osseous processes. Patient will be discharged home with prescriptions for meloxicam. Patient is to follow up with PCP as directed. Patient is given ED precautions to return to the ED for any worsening or new symptoms.   ____________________________________________  FINAL CLINICAL IMPRESSION(S) / ED DIAGNOSES  Final diagnoses:  Muscle strain      NEW MEDICATIONS STARTED DURING THIS VISIT:  Discharge Medication List as of 05/27/2016 12:28 PM    START taking these medications   Details  meloxicam (MOBIC) 15 MG tablet Take 1 tablet (15 mg total) by mouth daily., Starting Mon 05/27/2016, Until Thu 06/06/2016, Print            This chart was dictated using voice recognition software/Dragon. Despite best efforts to proofread, errors can occur which can change the meaning. Any change was purely unintentional.    Laban Emperor, PA-C 05/27/16 [place], MD 05/27/16 1324

## 2016-05-27 NOTE — ED Notes (Signed)
See triage note  States he slipped on ice   Fell back  Hit head on cement  Denies any LOC but conts to have neck pain

## 2019-05-25 ENCOUNTER — Observation Stay: Payer: BC Managed Care – PPO

## 2019-05-25 ENCOUNTER — Other Ambulatory Visit: Payer: Self-pay

## 2019-05-25 ENCOUNTER — Inpatient Hospital Stay
Admission: EM | Admit: 2019-05-25 | Discharge: 2019-05-27 | DRG: 816 | Disposition: A | Discharge status: Home / Self Care | Payer: BC Managed Care – PPO | Attending: Internal Medicine | Admitting: Internal Medicine

## 2019-05-25 ENCOUNTER — Emergency Department: Payer: BC Managed Care – PPO

## 2019-05-25 ENCOUNTER — Encounter: Payer: Self-pay | Admitting: Emergency Medicine

## 2019-05-25 DIAGNOSIS — N182 Chronic kidney disease, stage 2 (mild): Secondary | ICD-10-CM | POA: Diagnosis not present

## 2019-05-25 DIAGNOSIS — G8929 Other chronic pain: Secondary | ICD-10-CM | POA: Diagnosis present

## 2019-05-25 DIAGNOSIS — R6889 Other general symptoms and signs: Secondary | ICD-10-CM | POA: Diagnosis not present

## 2019-05-25 DIAGNOSIS — D72810 Lymphocytopenia: Secondary | ICD-10-CM | POA: Diagnosis not present

## 2019-05-25 DIAGNOSIS — Z79899 Other long term (current) drug therapy: Secondary | ICD-10-CM

## 2019-05-25 DIAGNOSIS — M79605 Pain in left leg: Secondary | ICD-10-CM | POA: Diagnosis present

## 2019-05-25 DIAGNOSIS — E538 Deficiency of other specified B group vitamins: Secondary | ICD-10-CM

## 2019-05-25 DIAGNOSIS — R000 Tachycardia, unspecified: Secondary | ICD-10-CM | POA: Diagnosis present

## 2019-05-25 DIAGNOSIS — Z720 Tobacco use: Secondary | ICD-10-CM | POA: Diagnosis present

## 2019-05-25 DIAGNOSIS — R252 Cramp and spasm: Secondary | ICD-10-CM | POA: Diagnosis not present

## 2019-05-25 DIAGNOSIS — N200 Calculus of kidney: Secondary | ICD-10-CM | POA: Diagnosis present

## 2019-05-25 DIAGNOSIS — D696 Thrombocytopenia, unspecified: Secondary | ICD-10-CM

## 2019-05-25 DIAGNOSIS — M79604 Pain in right leg: Secondary | ICD-10-CM | POA: Diagnosis present

## 2019-05-25 DIAGNOSIS — I739 Peripheral vascular disease, unspecified: Secondary | ICD-10-CM | POA: Diagnosis present

## 2019-05-25 DIAGNOSIS — R531 Weakness: Secondary | ICD-10-CM | POA: Diagnosis present

## 2019-05-25 DIAGNOSIS — M4802 Spinal stenosis, cervical region: Secondary | ICD-10-CM | POA: Diagnosis present

## 2019-05-25 DIAGNOSIS — R29898 Other symptoms and signs involving the musculoskeletal system: Secondary | ICD-10-CM | POA: Diagnosis present

## 2019-05-25 DIAGNOSIS — Z20822 Contact with and (suspected) exposure to covid-19: Secondary | ICD-10-CM | POA: Diagnosis present

## 2019-05-25 DIAGNOSIS — F17210 Nicotine dependence, cigarettes, uncomplicated: Secondary | ICD-10-CM | POA: Diagnosis present

## 2019-05-25 DIAGNOSIS — R600 Localized edema: Secondary | ICD-10-CM

## 2019-05-25 DIAGNOSIS — R251 Tremor, unspecified: Secondary | ICD-10-CM | POA: Diagnosis present

## 2019-05-25 LAB — APTT: aPTT: 46 seconds — ABNORMAL HIGH (ref 24–36)

## 2019-05-25 LAB — COMPREHENSIVE METABOLIC PANEL
ALT: 19 U/L (ref 0–44)
AST: 26 U/L (ref 15–41)
Albumin: 3.7 g/dL (ref 3.5–5.0)
Alkaline Phosphatase: 88 U/L (ref 38–126)
Anion gap: 9 (ref 5–15)
BUN: 15 mg/dL (ref 8–23)
CO2: 26 mmol/L (ref 22–32)
Calcium: 8.6 mg/dL — ABNORMAL LOW (ref 8.9–10.3)
Chloride: 109 mmol/L (ref 98–111)
Creatinine, Ser: 1.21 mg/dL (ref 0.61–1.24)
GFR calc Af Amer: >60 mL/min (ref >60)
GFR calc non Af Amer: >60 mL/min (ref >60)
Glucose, Bld: 103 mg/dL — ABNORMAL HIGH (ref 70–99)
Potassium: 4.4 mmol/L (ref 3.5–5.1)
Sodium: 144 mmol/L (ref 135–145)
Total Bilirubin: 1.2 mg/dL (ref 0.3–1.2)
Total Protein: 6.8 g/dL (ref 6.5–8.1)

## 2019-05-25 LAB — RESPIRATORY PANEL BY RT PCR (FLU A&B, COVID)
Influenza A by PCR: NEGATIVE
Influenza B by PCR: NEGATIVE
SARS Coronavirus 2 by RT PCR: NEGATIVE

## 2019-05-25 LAB — URINALYSIS, COMPLETE (UACMP) WITH MICROSCOPIC
Bacteria, UA: NONE SEEN
Bilirubin Urine: NEGATIVE
Glucose, UA: NEGATIVE mg/dL
Ketones, ur: NEGATIVE mg/dL
Leukocytes,Ua: NEGATIVE
Nitrite: NEGATIVE
Protein, ur: NEGATIVE mg/dL
Specific Gravity, Urine: 1.014 (ref 1.005–1.030)
Squamous Epithelial / LPF: NONE SEEN (ref 0–5)
pH: 5 (ref 5.0–8.0)

## 2019-05-25 LAB — CBC WITH DIFFERENTIAL/PLATELET
Abs Immature Granulocytes: 0.01 10*3/uL (ref 0.00–0.07)
Basophils Absolute: 0 10*3/uL (ref 0.0–0.1)
Basophils Relative: 1 %
Eosinophils Absolute: 0 10*3/uL (ref 0.0–0.5)
Eosinophils Relative: 1 %
HCT: 45.6 % (ref 39.0–52.0)
Hemoglobin: 15.3 g/dL (ref 13.0–17.0)
Immature Granulocytes: 1 %
Lymphocytes Relative: 18 %
Lymphs Abs: 0.2 10*3/uL — ABNORMAL LOW (ref 0.7–4.0)
MCH: 30.3 pg (ref 26.0–34.0)
MCHC: 33.6 g/dL (ref 30.0–36.0)
MCV: 90.3 fL (ref 80.0–100.0)
Monocytes Absolute: 0 10*3/uL — ABNORMAL LOW (ref 0.1–1.0)
Monocytes Relative: 1 %
Neutro Abs: 1 10*3/uL — ABNORMAL LOW (ref 1.7–7.7)
Neutrophils Relative %: 78 %
Platelets: 95 10*3/uL — ABNORMAL LOW (ref 150–400)
RBC: 5.05 MIL/uL (ref 4.22–5.81)
RDW: 13 % (ref 11.5–15.5)
WBC: 1.3 10*3/uL — CL (ref 4.0–10.5)
nRBC: 0 % (ref 0.0–0.2)

## 2019-05-25 LAB — CBC
HCT: 45.7 % (ref 39.0–52.0)
Hemoglobin: 15.2 g/dL (ref 13.0–17.0)
MCH: 30 pg (ref 26.0–34.0)
MCHC: 33.3 g/dL (ref 30.0–36.0)
MCV: 90.3 fL (ref 80.0–100.0)
Platelets: 94 10*3/uL — ABNORMAL LOW (ref 150–400)
RBC: 5.06 MIL/uL (ref 4.22–5.81)
RDW: 13 % (ref 11.5–15.5)
WBC: 1.3 10*3/uL — CL (ref 4.0–10.5)
nRBC: 0 % (ref 0.0–0.2)

## 2019-05-25 LAB — PROTIME-INR
INR: 1.4 — ABNORMAL HIGH (ref 0.8–1.2)
Prothrombin Time: 17.1 seconds — ABNORMAL HIGH (ref 11.4–15.2)

## 2019-05-25 LAB — HIV ANTIBODY (ROUTINE TESTING W REFLEX): HIV Screen 4th Generation wRfx: NONREACTIVE

## 2019-05-25 LAB — SAVE SMEAR (SSMR)

## 2019-05-25 LAB — LACTIC ACID, PLASMA: Lactic Acid, Venous: 2.3 mmol/L (ref 0.5–1.9)

## 2019-05-25 LAB — LACTATE DEHYDROGENASE: LDH: 313 U/L — ABNORMAL HIGH (ref 98–192)

## 2019-05-25 LAB — C-REACTIVE PROTEIN: CRP: 1.8 mg/dL — ABNORMAL HIGH (ref <1.0)

## 2019-05-25 LAB — PATHOLOGIST SMEAR REVIEW

## 2019-05-25 MED ORDER — NICOTINE 21 MG/24HR TD PT24
21.0000 mg | MEDICATED_PATCH | Freq: Every day | TRANSDERMAL | Status: DC
  Filled 2019-05-25 (×2): qty 1

## 2019-05-25 MED ORDER — GADOBUTROL 1 MMOL/ML IV SOLN
8.0000 mL | Freq: Once | INTRAVENOUS | Status: AC | PRN
  Administered 2019-05-25: 23:00:00 10 mL via INTRAVENOUS
  Filled 2019-05-25: qty 8

## 2019-05-25 MED ORDER — ONDANSETRON HCL 4 MG/2ML IJ SOLN: 4.0000 mg | Freq: Three times a day (TID) | INTRAMUSCULAR | Status: DC | PRN

## 2019-05-25 MED ORDER — OXYCODONE-ACETAMINOPHEN 5-325 MG PO TABS
2.0000 | ORAL_TABLET | Freq: Once | ORAL | Status: AC
  Administered 2019-05-25: 11:00:00 2 via ORAL
  Filled 2019-05-25: qty 2

## 2019-05-25 MED ORDER — VANCOMYCIN HCL IN DEXTROSE 1-5 GM/200ML-% IV SOLN
1000.0000 mg | Freq: Once | INTRAVENOUS | Status: AC
  Administered 2019-05-25: 14:00:00 1000 mg via INTRAVENOUS
  Filled 2019-05-25: qty 200

## 2019-05-25 MED ORDER — PIPERACILLIN-TAZOBACTAM 3.375 G IVPB 30 MIN
3.3750 g | Freq: Once | INTRAVENOUS | Status: AC
Start: 2019-05-25 — End: 2019-05-25
  Administered 2019-05-25: 13:00:00 3.375 g via INTRAVENOUS
  Filled 2019-05-25: qty 50

## 2019-05-25 MED ORDER — GABAPENTIN 300 MG PO CAPS
600.0000 mg | ORAL_CAPSULE | Freq: Every day | ORAL | Status: DC
  Administered 2019-05-26 (×2): 600 mg via ORAL
  Filled 2019-05-25 (×2): qty 2

## 2019-05-25 MED ORDER — SODIUM CHLORIDE 0.9 % IV SOLN: Freq: Once | INTRAVENOUS | Status: AC

## 2019-05-25 MED ORDER — SODIUM CHLORIDE 0.9 % IV SOLN: INTRAVENOUS | Status: DC

## 2019-05-25 MED ORDER — OXYCODONE-ACETAMINOPHEN 5-325 MG PO TABS
1.0000 | ORAL_TABLET | Freq: Four times a day (QID) | ORAL | Status: DC | PRN
Start: 2019-05-25 — End: 2019-05-27
  Administered 2019-05-25 – 2019-05-26 (×3): 1 via ORAL
  Filled 2019-05-25 (×3): qty 1

## 2019-05-25 MED ORDER — ACETAMINOPHEN 325 MG PO TABS
650.0000 mg | ORAL_TABLET | Freq: Four times a day (QID) | ORAL | Status: DC | PRN
  Administered 2019-05-25 – 2019-05-27 (×2): 650 mg via ORAL
  Filled 2019-05-25 (×2): qty 2

## 2019-05-25 NOTE — ED Provider Notes (Signed)
[facility] Emergency Department Provider Note       Time seen: ----------------------------------------- 11:00 AM on 05/25/2019 -----------------------------------------   I have reviewed the triage vital signs and the nursing notes.  HISTORY   Chief Complaint Tremors and Weakness    HPI Martin [name] is a 64 y.o. male with a history of cervical stenosis, kidney stones, neuropathy who presents to the ED for weakness, tremors, leg cramps that started today.  Patient reports feels like someone is squeezing his legs.  Martin [name] denies fevers or chills, denies vomiting or diarrhea but has had some nausea.  Patient states Martin [name] could not hold his car keys earlier because Martin [name] was shaking so much.  Martin [name] denies any neck or back pain.  Martin [name] denies any falls, trauma or heavy lifting  Past Medical History:  Diagnosis Date  . 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 Cervical spinal stenosis 05/09/2014  . Kidney stone   . Neuropathy     Patient Active Problem List   Diagnosis Date Noted  . Cervical spinal stenosis 05/09/2014  . 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 [place]   . CYSTOSCOPY/URETEROSCOPY/HOLMIUM LASER/STENT PLACEMENT  2002  . EXTRACORPOREAL SHOCK WAVE LITHOTRIPSY Right 06/29/2015   Procedure: EXTRACORPOREAL SHOCK WAVE LITHOTRIPSY (ESWL);  Surgeon: Nickie Retort, MD;  Location: [facility] ORS;  Service: Urology;  Laterality: Right;    Allergies Patient has no known allergies.  Social History Social History   Tobacco Use  . Smoking status: Current Every Day Smoker    Packs/day: 0.50    Types: Cigarettes  . Smokeless tobacco: Never Used  Substance Use Topics  . Alcohol use: Yes    Alcohol/week: 0.0 standard drinks    Comment: rarely  . Drug use: No    Review of Systems Constitutional: Negative for fever. Cardiovascular: Negative for chest pain. Respiratory: Negative  for shortness of breath. Gastrointestinal: Negative for abdominal pain, positive for nausea Musculoskeletal: Negative for back pain.  Positive for leg cramps Skin: Negative for rash. Neurological: Positive for weakness, tremors  All systems negative/normal/unremarkable except as stated in the HPI  ____________________________________________   PHYSICAL EXAM:  VITAL SIGNS: ED Triage Vitals  Enc Vitals Group     BP 05/25/19 0959 (!) 148/83     Pulse Rate 05/25/19 0959 (!) 105     Resp 05/25/19 0959 20     Temp 05/25/19 0959 98.8 F (37.1 C)     Temp Source 05/25/19 0959 Oral     SpO2 05/25/19 0959 98 %     Weight 05/25/19 0958 198 lb (89.8 kg)     Height 05/25/19 0958 '5\' 9"'  (1.753 m)     Head Circumference --      Peak Flow --      Pain Score 05/25/19 0958 10     Pain Loc --      Pain Edu? --      Excl. in [place]? --    Constitutional: Alert and oriented. Well appearing and in no distress. ENT      Head: Normocephalic and atraumatic.      Nose: No congestion/rhinnorhea.      Mouth/Throat: Mucous membranes are moist.      Neck: No stridor. Cardiovascular: Normal rate, regular rhythm. No murmurs, rubs, or gallops.  Excellent dopplerable pulses in the posterior tibial and dorsalis pedis pulses bilaterally Respiratory: Normal respiratory effort without tachypnea nor  retractions. Breath sounds are clear and equal bilaterally. No wheezes/rales/rhonchi. Gastrointestinal: Soft and nontender. Normal bowel sounds Musculoskeletal: Pain with range of motion of the lower extremities Neurologic:  Normal speech and language. No gross focal neurologic deficits are appreciated.  Skin:  Skin is warm, dry and intact. No rash noted. Psychiatric: Mood and affect are normal. Speech and behavior are normal.  ____________________________________________  EKG: Interpreted by me.  Sinus tachycardia with rate of 109 bpm, normal PR interval, normal QRS, normal  QT  ____________________________________________  ED COURSE:  As part of my medical decision making, I reviewed the following data within the [place] History obtained from family if available, nursing notes, old chart and ekg, as well as notes from prior ED visits. Patient presented for weakness and tremors, we will assess with labs and imaging as indicated at this time. Clinical Course as of May 24 1309  Tue May 25, 2019  1138 Differential [JW]    Clinical Course User Index [JW] Earleen Newport, MD   Procedures  Martin [name] was evaluated in Emergency Department on 05/25/2019 for the symptoms described in the history of present illness. Martin [name] was evaluated in the context of the global COVID-19 pandemic, which necessitated consideration that the patient might be at risk for infection with the SARS-CoV-2 virus that causes COVID-19. Institutional protocols and algorithms that pertain to the evaluation of patients at risk for COVID-19 are in a state of rapid change based on information released by regulatory bodies including the CDC and federal and state organizations. These policies and algorithms were followed during the patient's care in the ED.  ____________________________________________   LABS (pertinent positives/negatives)  Labs Reviewed  CBC - Abnormal; Notable for the following components:      Result Value   WBC 1.3 (*)    Platelets 94 (*)    All other components within normal limits  URINALYSIS, COMPLETE (UACMP) WITH MICROSCOPIC - Abnormal; Notable for the following components:   Color, Urine YELLOW (*)    APPearance CLEAR (*)    Hgb urine dipstick MODERATE (*)    All other components within normal limits  COMPREHENSIVE METABOLIC PANEL - Abnormal; Notable for the following components:   Glucose, Bld 103 (*)    Calcium 8.6 (*)    All other components within normal limits  LACTIC ACID, PLASMA - Abnormal; Notable for the following components:    Lactic Acid, Venous 2.3 (*)    All other components within normal limits  CBC WITH DIFFERENTIAL/PLATELET - Abnormal; Notable for the following components:   WBC 1.3 (*)    Platelets 95 (*)    Neutro Abs 1.0 (*)    Lymphs Abs 0.2 (*)    Monocytes Absolute 0.0 (*)    All other components within normal limits  RESPIRATORY PANEL BY RT PCR (FLU A&B, COVID)  CULTURE, BLOOD (ROUTINE X 2)  CULTURE, BLOOD (ROUTINE X 2)  CBC WITH DIFFERENTIAL/PLATELET  C-REACTIVE PROTEIN  PATHOLOGIST SMEAR REVIEW   CRITICAL CARE Performed by: Laurence Aly   Total critical care time: 30 minutes  Critical care time was exclusive of separately billable procedures and treating other patients.  Critical care was necessary to treat or prevent imminent or life-threatening deterioration.  Critical care was time spent personally by me on the following activities: development of treatment plan with patient and/or surrogate as well as nursing, discussions with consultants, evaluation of patient's response to treatment, examination of patient, obtaining history from patient or surrogate, ordering  and performing treatments and interventions, ordering and review of laboratory studies, ordering and review of radiographic studies, pulse oximetry and re-evaluation of patient's condition.  RADIOLOGY Images were viewed by me  Chest x-ray is unremarkable  ____________________________________________   DIFFERENTIAL DIAGNOSIS   COVID-19, spasm, degenerative disc disease, peripheral vascular disease, chronic pain, neuropathy  FINAL ASSESSMENT AND PLAN  Rigors, lymphopenia, thrombocytopenia   Plan: The patient had presented for bilateral leg pain and shaking. Patient's labs surprisingly revealed lymphopenia and thrombocytopenia of uncertain etiology. Patient's imaging was unremarkable.  I discussed with oncology who recommends a pathologist smear review.  We have ordered broad-spectrum antibiotics to cover for  possible sepsis and Martin [name] may need a bone marrow biopsy.   Laurence Aly, MD    Note: This note was generated in part or whole with voice recognition software. Voice recognition is usually quite accurate but there are transcription errors that can and very often do occur. I apologize for any typographical errors that were not detected and corrected.     Earleen Newport, MD 05/25/19 1316

## 2019-05-25 NOTE — ED Notes (Signed)
Date and time results received: 05/25/19 1135   Test: WBC Critical Value: 1.9  Name of Provider Notified: Dr. Jimmye Norman

## 2019-05-25 NOTE — ED Triage Notes (Signed)
Pt here for generalized weakness, tremors, and leg cramps that started today.  Pt reports feels like someone is squeezing his legs.  No vomiting or diarrhea. Has had some nausea. EMS gave 4 mg zofran. CBG 129 with EMS.  Unlabored at this time.  Tremors noted to hands.

## 2019-05-25 NOTE — ED Notes (Signed)
Pt transferred to room 37. Pt in NAD on arrival, no complaints noted. Will continue to monitor.

## 2019-05-25 NOTE — ED Notes (Signed)
Pt transported to MRI 

## 2019-05-25 NOTE — Consult Note (Signed)
[facility]  Date of admission:  05/25/2019  Inpatient day:  05/25/2019  Consulting physician: Dr Ivor Costa   Reason for Consultation:  Lymphopenia and thrombocytopenia.  Chief Complaint: Martin [name] is a 64 y.o. male admitted with rigors, lymphopenia and thrombocytopenia.  HPI:   The patient denies any prior history of low blood counts.  He states that he does not have a primary care physician.  He has been followed by Dr Sharlet Salina for right C7 radiculopathy and treated with gabapentin.  Approximately 3 weeks ago, the patient describes a 3 day history of shaking chills.  He denied any fevers or sweats.  The event resolved spontaneously.  He did not seek medical attention.  The patient was  at work today when he described "not feeling right", his hand shaking, then his legs getting weak.  He denied any sweats.  He describes a sensation of a "vice on my legs".  He called 911.  Today's symptoms were similar to the event 3 weeks ago except for the sensation in his legs.  He denies any precipitating event.  He denies any new medication or herbal product.  He does not drink quinine water.  He denies any exposures to radiation or toxins.  He denies any tick or insect bites.  He has not traveled outside of the [place].  He presented to the [facility] ER.  He was afebrile (98.8).  CBC revealed a hematocrit of 45.6, hemoglobin 15.3, MCV 90.3, platelets 95,000, WBC 1300 with an ANC of 1000.  Differential was unremarkable.  Creatinine was 1.21.  Bilirubin was 1.2.  Lactic acid was 2.3 (0.5 - 1.9).  LDH was 313 (98-192).  Urinalysis revealed moderate hemoglobin and no WBC and no RBCs.  PT was 17.1 (INR 1.4) and PTT 46 (24-36).  HIV negative.  COVID-19 testing was negative.  Peripheral smear revealed absolute leukopenia with lymphopenia.  RBCs were normochromic, normocytic with unremarkable morphology.  There were no schistocytes.  There were rare giant platelets and no blasts.     CXR revealed no acute abnormality.  Prior CBC on 06/21/2015 revealed a hematocrit of 42.6, hemoglobin 14.7, MCV 88.7, platelets 161,000, WBC 8600.  Creatinine was 1.02 on 07/07/2015.  Blood cultures were obtained.  He was started on broad spectrum antibiotics (vancomycin and Zosyn).  Symptomatically, he is feeling better.  He denies any rigors.  Diet is good.  He has not lost weight.   Past Medical History:  Diagnosis Date  . 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 Cervical spinal stenosis 05/09/2014  . Kidney stone   . Neuropathy     Past Surgical History:  Procedure Laterality Date  . [place]   . CYSTOSCOPY/URETEROSCOPY/HOLMIUM LASER/STENT PLACEMENT  2002  . EXTRACORPOREAL SHOCK WAVE LITHOTRIPSY Right 06/29/2015   Procedure: EXTRACORPOREAL SHOCK WAVE LITHOTRIPSY (ESWL);  Surgeon: Nickie Retort, MD;  Location: [facility] ORS;  Service: Urology;  Laterality: Right;    Family History  Problem Relation Age of Onset  . Prostate cancer Neg Hx   . Bladder Cancer Neg Hx   . Kidney cancer Neg Hx     Social History:  reports that he has been smoking cigarettes. He has been smoking about 0.50 packs per day. He has never used smokeless tobacco. He reports current alcohol use. He reports that he does not use drugs.  He has smoked 1/2 pack per week since the age of 43.  He drinks alcohol "now and  then".  He is a Physiological scientist.  He denies any exposure to toxins.  Allergies: No Known Allergies  (Not in a hospital admission)  Review of Systems: GENERAL:  Feels "ok".  Chills/rigors 3 weeks ago and today.  No fevers, sweats or weight loss. PERFORMANCE STATUS (ECOG):  1 HEENT:  No visual changes, runny nose, sore throat, mouth sores or tenderness. Lungs: No shortness of breath or cough.  No hemoptysis. Cardiac:  No chest pain, palpitations, orthopnea, or PND. GI:  No nausea, vomiting, diarrhea, constipation, melena or hematochezia. GU:  No urgency, frequency,  dysuria, or hematuria. Musculoskeletal:  Leg muscles ache and feel weak "like being in a vice".  No back pain.  No joint pain.  Extremities:  No pain or swelling. Skin:  No rashes or skin changes. Neuro:  Little headache.  Leg weak today.  No balance or coordination issues. Endocrine:  No diabetes, thyroid issues, hot flashes or night sweats. Psych:  No mood changes, depression or anxiety. Pain:  No focal pain. Review of systems:  All other systems reviewed and found to be negative.  Physical Exam:  Blood pressure (!) 141/84, pulse 67, temperature 98.8 F (37.1 C), temperature source Oral, resp. rate 18, height 5\' 9"  (1.753 m), weight 198 lb (89.8 kg), SpO2 97 %.  GENERAL:  Well developed, well nourished, gentleman lying comfortably in the ER in no acute distress. MENTAL STATUS:  Alert and oriented to person, place and time. HEAD:  Wearing a cap and mask.  Gray hair.  Normocephalic, atraumatic, face symmetric, no Cushingoid features. EYES:  Glasses.  Blue eyes.  Pupils equal round and reactive to light and accomodation.  No conjunctivitis or scleral icterus. ENT:  Oropharynx clear without lesion.  Tongue normal. Mucous membranes moist.  RESPIRATORY:  Clear to auscultation without rales, wheezes or rhonchi. CARDIOVASCULAR:  Regular rate and rhythm without murmur, rub or gallop. ABDOMEN:  Soft, non-tender, with active bowel sounds, and no appreciable hepatosplenomegaly.  No masses. SKIN:  No rashes, ulcers or lesions. EXTREMITIES: Legs minimally tender to palpation.  No stigmata of SBE.  No edema or skin discoloration.  No palpable cords. LYMPH NODES: No palpable cervical, supraclavicular, axillary or inguinal adenopathy  NEUROLOGICAL: Unremarkable. PSYCH:  Appropriate.   Results for orders placed or performed during the hospital encounter of 05/25/19 (from the past 48 hour(s))  CBC     Status: Abnormal   Collection Time: 05/25/19 10:01 AM  Result Value Ref Range   WBC 1.3 (LL) 4.0 -  10.5 K/uL    Comment: This critical result has verified and been called to [facility] by Vladimir Crofts on 01 19 2021 at 1121, and has been read back.    RBC 5.06 4.22 - 5.81 MIL/uL   Hemoglobin 15.2 13.0 - 17.0 g/dL   HCT 45.7 39.0 - 52.0 %   MCV 90.3 80.0 - 100.0 fL   MCH 30.0 26.0 - 34.0 pg   MCHC 33.3 30.0 - 36.0 g/dL   RDW 13.0 11.5 - 15.5 %   Platelets 94 (L) 150 - 400 K/uL    Comment: PLATELET COUNT CONFIRMED BY SMEAR Immature Platelet Fraction may be clinically indicated, consider ordering this additional test [id number]    nRBC 0.0 0.0 - 0.2 %    Comment: Performed at [facility], [address] [place], [place] [address]  Urinalysis, Complete w Microscopic     Status: Abnormal   Collection Time: 05/25/19 10:01 AM  Result Value Ref Range  Color, Urine YELLOW (A) YELLOW   APPearance CLEAR (A) CLEAR   Specific Gravity, Urine 1.014 1.005 - 1.030   pH 5.0 5.0 - 8.0   Glucose, UA NEGATIVE NEGATIVE mg/dL   Hgb urine dipstick MODERATE (A) NEGATIVE   Bilirubin Urine NEGATIVE NEGATIVE   Ketones, ur NEGATIVE NEGATIVE mg/dL   Protein, ur NEGATIVE NEGATIVE mg/dL   Nitrite NEGATIVE NEGATIVE   Leukocytes,Ua NEGATIVE NEGATIVE   RBC / HPF 0-5 0 - 5 RBC/hpf   WBC, UA 0-5 0 - 5 WBC/hpf   Bacteria, UA NONE SEEN NONE SEEN   Squamous Epithelial / LPF NONE SEEN 0 - 5   Mucus PRESENT     Comment: Performed at [facility], [address] [place], [place] [address]  Comprehensive metabolic panel     Status: Abnormal   Collection Time: 05/25/19 10:01 AM  Result Value Ref Range   Sodium 144 135 - 145 mmol/L   Potassium 4.4 3.5 - 5.1 mmol/L    Comment: HEMOLYSIS AT THIS LEVEL MAY AFFECT RESULT   Chloride 109 98 - 111 mmol/L   CO2 26 22 - 32 mmol/L   Glucose, Bld 103 (H) 70 - 99 mg/dL   BUN 15 8 - 23 mg/dL   Creatinine, Ser 1.21 0.61 - 1.24 mg/dL   Calcium 8.6 (L) 8.9 - 10.3 mg/dL   Total Protein 6.8 6.5 - 8.1 g/dL   Albumin 3.7 3.5 - 5.0 g/dL   AST 26 15 - 41  U/L    Comment: HEMOLYSIS AT THIS LEVEL MAY AFFECT RESULT   ALT 19 0 - 44 U/L   Alkaline Phosphatase 88 38 - 126 U/L   Total Bilirubin 1.2 0.3 - 1.2 mg/dL    Comment: HEMOLYSIS AT THIS LEVEL MAY AFFECT RESULT   GFR calc non Af Amer >60 >60 mL/min   GFR calc Af Amer >60 >60 mL/min   Anion gap 9 5 - 15    Comment: Performed at [facility], [address] [place], [place] [address]  CBC with Differential/Platelet     Status: Abnormal   Collection Time: 05/25/19 10:01 AM  Result Value Ref Range   WBC 1.3 (LL) 4.0 - 10.5 K/uL    Comment: REPEATED TO VERIFY THIS CRITICAL RESULT HAS VERIFIED AND BEEN CALLED TO KATE BUMGARNER BY HINA PATEL ON 01 19 2021 AT 1243, AND HAS BEEN READ BACK.  THIS CRITICAL RESULT HAS VERIFIED AND BEEN CALLED TO KATE BUMGARNER BY HINA PATEL ON 01 19 2021 AT 1244, AND HAS BEEN READ BACK.     RBC 5.05 4.22 - 5.81 MIL/uL   Hemoglobin 15.3 13.0 - 17.0 g/dL   HCT 45.6 39.0 - 52.0 %   MCV 90.3 80.0 - 100.0 fL   MCH 30.3 26.0 - 34.0 pg   MCHC 33.6 30.0 - 36.0 g/dL   RDW 13.0 11.5 - 15.5 %   Platelets 95 (L) 150 - 400 K/uL    Comment: PLATELET COUNT CONFIRMED BY SMEAR Immature Platelet Fraction may be clinically indicated, consider ordering this additional test [id number]    nRBC 0.0 0.0 - 0.2 %   Neutrophils Relative % 78 %   Neutro Abs 1.0 (L) 1.7 - 7.7 K/uL   Lymphocytes Relative 18 %   Lymphs Abs 0.2 (L) 0.7 - 4.0 K/uL   Monocytes Relative 1 %   Monocytes Absolute 0.0 (L) 0.1 - 1.0 K/uL   Eosinophils Relative 1 %   Eosinophils Absolute 0.0 0.0 - 0.5 K/uL  Basophils Relative 1 %   Basophils Absolute 0.0 0.0 - 0.1 K/uL   Immature Granulocytes 1 %   Abs Immature Granulocytes 0.01 0.00 - 0.07 K/uL    Comment: Performed at [facility] [address], [place], [place] [address]  Pathologist smear review     Status: None   Collection Time: 05/25/19 10:01 AM  Result Value Ref Range   Path Review Blood smear is reviewed.     Comment:  Absolute leukopenia with lymphopenia. ANC of 1.0k/uL. Normochomic, normocytic erythrocytes, with unremarkable morphology. Thrombocytopenia with rare giant platelets. The cause of the patient's cytopenias is unclear from morphologic review alone. Potential secondary causes of cytopenias should be considered, including nutritional deficiencies, medication/toxin exposure, viral illness, and/or autoimmune/rheumatologic disease. Reviewed by Kathi Simpers, M.D. Performed at [facility], [address] [place], [place] [address]   Respiratory Panel by RT PCR (Flu A&B, Covid) - Nasopharyngeal Swab     Status: None   Collection Time: 05/25/19 11:16 AM   Specimen: Nasopharyngeal Swab  Result Value Ref Range   SARS Coronavirus 2 by RT PCR NEGATIVE NEGATIVE    Comment: (NOTE) SARS-CoV-2 target nucleic acids are NOT DETECTED. The SARS-CoV-2 RNA is generally detectable in upper respiratoy specimens during the acute phase of infection. The lowest concentration of SARS-CoV-2 viral copies this assay can detect is 131 copies/mL. A negative result does not preclude SARS-Cov-2 infection and should not be used as the sole basis for treatment or other patient management decisions. A negative result may occur with  improper specimen collection/handling, submission of specimen other than nasopharyngeal swab, presence of viral mutation(s) within the areas targeted by this assay, and inadequate number of viral copies (<131 copies/mL). A negative result must be combined with clinical observations, patient history, and epidemiological information. The expected result is Negative. Fact Sheet for Patients:  PinkCheek.be Fact Sheet for Healthcare Providers:  GravelBags.it This test is not yet ap proved or cleared by the [place] FDA and  has been authorized for detection and/or diagnosis of SARS-CoV-2 by FDA under an Emergency Use  Authorization (EUA). This EUA will remain  in effect (meaning this test can be used) for the duration of the COVID-19 declaration under Section 564(b)(1) of the Act, 21 U.S.C. section 360bbb-3(b)(1), unless the authorization is terminated or revoked sooner.    Influenza A by PCR NEGATIVE NEGATIVE   Influenza B by PCR NEGATIVE NEGATIVE    Comment: (NOTE) The Xpert Xpress SARS-CoV-2/FLU/RSV assay is intended as an aid in  the diagnosis of influenza from Nasopharyngeal swab specimens and  should not be used as a sole basis for treatment. Nasal washings and  aspirates are unacceptable for Xpert Xpress SARS-CoV-2/FLU/RSV  testing. Fact Sheet for Patients: PinkCheek.be Fact Sheet for Healthcare Providers: GravelBags.it This test is not yet approved or cleared by the [place] FDA and  has been authorized for detection and/or diagnosis of SARS-CoV-2 by  FDA under an Emergency Use Authorization (EUA). This EUA will remain  in effect (meaning this test can be used) for the duration of the  Covid-19 declaration under Section 564(b)(1) of the Act, 21  U.S.C. section 360bbb-3(b)(1), unless the authorization is  terminated or revoked. Performed at [facility], [address] [place], [place] [address]   Lactic acid, plasma     Status: Abnormal   Collection Time: 05/25/19 11:58 AM  Result Value Ref Range   Lactic Acid, Venous 2.3 (HH) 0.5 - 1.9 mmol/L    Comment:  CRITICAL RESULT CALLED TO, READ BACK BY AND VERIFIED WITH KATE BUMGARNER AT 1240ON 05/25/2019 [place]. Performed at [facility], [address] [place], [place] [address]   C-reactive protein     Status: Abnormal   Collection Time: 05/25/19 11:58 AM  Result Value Ref Range   CRP 1.8 (H) <1.0 mg/dL    Comment: Performed at [facility] [address] [place], [place] [address]  Lactate dehydrogenase     Status: Abnormal   Collection Time:  05/25/19  3:38 PM  Result Value Ref Range   LDH 313 (H) 98 - 192 U/L    Comment: Performed at [facility], [address] [place], [place] [address]  Save Smear     Status: None   Collection Time: 05/25/19  3:38 PM  Result Value Ref Range   Smear Review SMEAR STAINED AND AVAILABLE FOR REVIEW     Comment: Performed at [facility], [address] [place], [place] [address]  HIV Antibody (routine testing w rflx)     Status: None   Collection Time: 05/25/19  4:59 PM  Result Value Ref Range   HIV Screen 4th Generation wRfx NON REACTIVE NON REACTIVE    Comment: Performed at [facility], [place] [address] [place], [place] [address]  Protime-INR     Status: Abnormal   Collection Time: 05/25/19  4:59 PM  Result Value Ref Range   Prothrombin Time 17.1 (H) 11.4 - 15.2 seconds   INR 1.4 (H) 0.8 - 1.2    Comment: (NOTE) INR goal varies based on device and disease states. Performed at [facility], [address] [place], [place] [address]   APTT     Status: Abnormal   Collection Time: 05/25/19  4:59 PM  Result Value Ref Range   aPTT 46 (H) 24 - 36 seconds    Comment:        IF BASELINE aPTT IS ELEVATED, SUGGEST PATIENT RISK ASSESSMENT BE USED TO DETERMINE APPROPRIATE ANTICOAGULANT THERAPY. Performed at [facility], [address] [place], [place] [address]    DG Chest 1 View  Result Date: 05/25/2019 CLINICAL DATA:  Weakness EXAM: CHEST  1 VIEW COMPARISON:  06/09/2010 FINDINGS: Cardiac shadow is within normal limits. The lungs are well aerated bilaterally. No focal infiltrate or sizable effusion is seen. No bony abnormality is noted. Old healed left clavicular fracture is seen. IMPRESSION: No acute abnormality noted. Electronically Signed   By: Inez Catalina M.D.   On: 05/25/2019 13:04    Assessment:  The patient is a 64 y.o. gentleman with leukopenia and thrombocytopenia of unclear duration.  He presented with rigors and generalized  weakness.  He denies any new medications or herbal products.  He denies alcohol use.  Peripheral smear revealed absolute leukopenia with lymphopenia.  RBCs were normochromic, normocytic with unremarkable morphology.  There were no schistocytes.  There were rare giant platelets and no blasts.   Symptomatically, he denies any early satiety or weight loss.  Exam reveals no adenopathy or hepatosplenomegaly.  Plan:   1.   Leukopenia and thrombocytopenia  Etiology unclear.  Prior to recent events, CBC on 06/21/2015 was normal.  Peripheral smear reveals no blasts or platelet clumping.  HIV and COVID-19 negative.  PT 17.1 (INR 1.4) and PTT 46 (24-36).   Work-up:  ANA with reflex, B12, folate, copper level, hepatitis B and C testing, fibrinogen.  Neutropenic precautions. 2.   Rigors  Etiology unclear, but possibly  related to infection.   CXR and urinalysis do not suggest infection.  Blood cultures obtained and broad spectrum antibiotic initiated.  Check CPK secondary to muscle pain.  Thank you for allowing me to participate in SIRJAMES NIM 's care.  I will follow him closely with you while hospitalized and after discharge in the outpatient department.   Lequita Asal, MD  05/25/2019, 9:55 PM

## 2019-05-25 NOTE — H&P (Addendum)
History and Physical    ALYSSA DEUTSCHMAN [id number] DOB: [birth year] DOA: 05/25/2019  Referring MD/NP/PA:   PCP: Dion Body, MD   Patient coming from:  The patient is coming from home.  At baseline, pt is independent for most of ADL.        Chief Complaint: Generalized weakness, bilateral leg tightness  HPI: Martin [name] is a 64 y.o. male with medical history significant of kidney stone, cervical spinal stenosis, tobacco abuse, CKD 32, who presents with generalized weakness of bilateral leg tightness and weakness.  Patient states that his symptoms started yesterday, including generalized weakness, bilateral leg weakness, tightness, rigor, and hand tremors.  Patient does not have numbness or tingling in extremities.  No facial droop or slurred speech.  No fever or chills.  Patient does not have chest pain, shortness breath, cough.  No nausea, vomiting, diarrhea, abdominal pain, symptoms of UTI.   ED Course: pt was found to have WBC 1.3, lymphopenia, hemoglobin 15.3, platelet 95, negative RVP for Covid test, negative urinalysis, lactic acid of 2.3, creatinine 1.21, BUN 15, temperature normal, blood pressure 126/74, tachycardia, tachypnea, oxygen saturation 95% on room air.  Chest x-ray is negative.  Patient is placed on MedSurg bed for observation.  Hematology, Dr. Patsy Baltimore was consulted.  Review of Systems:   General: no fevers, chills, no body weight gain, has fatigue and weakness HEENT: no blurry vision, hearing changes or sore throat Respiratory: no dyspnea, coughing, wheezing CV: no chest pain, no palpitations GI: no nausea, vomiting, abdominal pain, diarrhea, constipation GU: no dysuria, burning on urination, increased urinary frequency, hematuria  Ext: no leg edema Neuro: no unilateral weakness, numbness, or tingling, no vision change or hearing loss.  Skin: no rash, no skin tear. MSK: No muscle spasm, no deformity, no limitation of range of movement in spin. Has  bilateral leg tightness and weakness. Heme: No easy bruising.  Travel history: No recent long distant travel.  Allergy: No Known Allergies  Past Medical History:  Diagnosis Date  . 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 Cervical spinal stenosis 05/09/2014  . Kidney stone   . Neuropathy     Past Surgical History:  Procedure Laterality Date  . [place]   . CYSTOSCOPY/URETEROSCOPY/HOLMIUM LASER/STENT PLACEMENT  2002  . EXTRACORPOREAL SHOCK WAVE LITHOTRIPSY Right 06/29/2015   Procedure: EXTRACORPOREAL SHOCK WAVE LITHOTRIPSY (ESWL);  Surgeon: Nickie Retort, MD;  Location: [facility] ORS;  Service: Urology;  Laterality: Right;    Social History:  reports that he has been smoking cigarettes. He has been smoking about 0.50 packs per day. He has never used smokeless tobacco. He reports current alcohol use. He reports that he does not use drugs.  Family History:  Family History  Problem Relation Age of Onset  . Prostate cancer Neg Hx   . Bladder Cancer Neg Hx   . Kidney cancer Neg Hx      Prior to Admission medications   Medication Sig Start Date End Date Taking? Authorizing Provider  gabapentin (NEURONTIN) 300 MG capsule Take 600 mg by mouth at bedtime. 06/09/15  Yes [provider]    Physical Exam: Vitals:   05/25/19 1430 05/25/19 1500 05/25/19 1650 05/25/19 2006  BP: 117/71 110/63 115/62 (!) 141/84  Pulse: 89 86 73 67  Resp: (!) 21 20 (!) 21 18  Temp:      TempSrc:      SpO2: 94% 96% 98% 97%  Weight:  Height:       General: Not in acute distress HEENT:       Eyes: PERRL, EOMI, no scleral icterus.       ENT: No discharge from the ears and nose, no pharynx injection, no tonsillar enlargement.        Neck: No JVD, no bruit, no mass felt. Heme: No neck lymph node enlargement. Cardiac: S1/S2, RRR, No murmurs, No gallops or rubs. Respiratory:  No rales, wheezing, rhonchi or rubs. GI: Soft, nondistended, nontender, no rebound pain, no  organomegaly, BS present. GU: No hematuria Ext: No pitting leg edema bilaterally. 2+DP/PT pulse bilaterally. Musculoskeletal: No joint deformities, No joint redness or warmth, no limitation of ROM in spin. Skin: No rashes.  Neuro: Alert, oriented X3, cranial nerves II-XII grossly intact, moves all extremities normally. Muscle strength 4/5 in all extremities, sensation to light touch intact.  Psych: Patient is not psychotic, no suicidal or hemocidal ideation.  Labs on Admission: I have personally reviewed following labs and imaging studies  CBC: Recent Labs  Lab 05/25/19 1001  WBC 1.3*  1.3*  NEUTROABS 1.0*  HGB 15.3  15.2  HCT 45.6  45.7  MCV 90.3  90.3  PLT 95*  94*   Basic Metabolic Panel: Recent Labs  Lab 05/25/19 1001  NA 144  K 4.4  CL 109  CO2 26  GLUCOSE 103*  BUN 15  CREATININE 1.21  CALCIUM 8.6*   GFR: Estimated Creatinine Clearance: 71 mL/min (by C-G formula based on SCr of 1.21 mg/dL). Liver Function Tests: Recent Labs  Lab 05/25/19 1001  AST 26  ALT 19  ALKPHOS 88  BILITOT 1.2  PROT 6.8  ALBUMIN 3.7   No results for input(s): LIPASE, AMYLASE in the last 168 hours. No results for input(s): AMMONIA in the last 168 hours. Coagulation Profile: Recent Labs  Lab 05/25/19 1659  INR 1.4*   Cardiac Enzymes: No results for input(s): CKTOTAL, CKMB, CKMBINDEX, TROPONINI in the last 168 hours. BNP (last 3 results) No results for input(s): PROBNP in the last 8760 hours. HbA1C: No results for input(s): HGBA1C in the last 72 hours. CBG: No results for input(s): GLUCAP in the last 168 hours. Lipid Profile: No results for input(s): CHOL, HDL, LDLCALC, TRIG, CHOLHDL, LDLDIRECT in the last 72 hours. Thyroid Function Tests: No results for input(s): TSH, T4TOTAL, FREET4, T3FREE, THYROIDAB in the last 72 hours. Anemia Panel: No results for input(s): VITAMINB12, FOLATE, FERRITIN, TIBC, IRON, RETICCTPCT in the last 72 hours. Urine analysis:    Component  Value Date/Time   COLORURINE YELLOW (A) 05/25/2019 1001   APPEARANCEUR CLEAR (A) 05/25/2019 1001   APPEARANCEUR Clear 07/07/2015 1012   LABSPEC 1.014 05/25/2019 1001   PHURINE 5.0 05/25/2019 1001   GLUCOSEU NEGATIVE 05/25/2019 1001   HGBUR MODERATE (A) 05/25/2019 1001   BILIRUBINUR NEGATIVE 05/25/2019 1001   BILIRUBINUR Negative 07/07/2015 1012   KETONESUR NEGATIVE 05/25/2019 1001   PROTEINUR NEGATIVE 05/25/2019 1001   NITRITE NEGATIVE 05/25/2019 1001   LEUKOCYTESUR NEGATIVE 05/25/2019 1001   Sepsis Labs: @LABRCNTIP (procalcitonin:4,lacticidven:4) ) Recent Results (from the past 240 hour(s))  Respiratory Panel by RT PCR (Flu A&B, Covid) - Nasopharyngeal Swab     Status: None   Collection Time: 05/25/19 11:16 AM   Specimen: Nasopharyngeal Swab  Result Value Ref Range Status   SARS Coronavirus 2 by RT PCR NEGATIVE NEGATIVE Final    Comment: (NOTE) SARS-CoV-2 target nucleic acids are NOT DETECTED. The SARS-CoV-2 RNA is generally detectable in upper respiratoy specimens during  the acute phase of infection. The lowest concentration of SARS-CoV-2 viral copies this assay can detect is 131 copies/mL. A negative result does not preclude SARS-Cov-2 infection and should not be used as the sole basis for treatment or other patient management decisions. A negative result may occur with  improper specimen collection/handling, submission of specimen other than nasopharyngeal swab, presence of viral mutation(s) within the areas targeted by this assay, and inadequate number of viral copies (<131 copies/mL). A negative result must be combined with clinical observations, patient history, and epidemiological information. The expected result is Negative. Fact Sheet for Patients:  PinkCheek.be Fact Sheet for Healthcare Providers:  GravelBags.it This test is not yet ap proved or cleared by the [place] FDA and  has been authorized for  detection and/or diagnosis of SARS-CoV-2 by FDA under an Emergency Use Authorization (EUA). This EUA will remain  in effect (meaning this test can be used) for the duration of the COVID-19 declaration under Section 564(b)(1) of the Act, 21 U.S.C. section 360bbb-3(b)(1), unless the authorization is terminated or revoked sooner.    Influenza A by PCR NEGATIVE NEGATIVE Final   Influenza B by PCR NEGATIVE NEGATIVE Final    Comment: (NOTE) The Xpert Xpress SARS-CoV-2/FLU/RSV assay is intended as an aid in  the diagnosis of influenza from Nasopharyngeal swab specimens and  should not be used as a sole basis for treatment. Nasal washings and  aspirates are unacceptable for Xpert Xpress SARS-CoV-2/FLU/RSV  testing. Fact Sheet for Patients: PinkCheek.be Fact Sheet for Healthcare Providers: GravelBags.it This test is not yet approved or cleared by the [place] FDA and  has been authorized for detection and/or diagnosis of SARS-CoV-2 by  FDA under an Emergency Use Authorization (EUA). This EUA will remain  in effect (meaning this test can be used) for the duration of the  Covid-19 declaration under Section 564(b)(1) of the Act, 21  U.S.C. section 360bbb-3(b)(1), unless the authorization is  terminated or revoked. Performed at [facility], [address] [place], [place] [address]      Radiological Exams on Admission: DG Chest 1 View  Result Date: 05/25/2019 CLINICAL DATA:  Weakness EXAM: CHEST  1 VIEW COMPARISON:  06/09/2010 FINDINGS: Cardiac shadow is within normal limits. The lungs are well aerated bilaterally. No focal infiltrate or sizable effusion is seen. No bony abnormality is noted. Old healed left clavicular fracture is seen. IMPRESSION: No acute abnormality noted. Electronically Signed   By: Inez Catalina M.D.   On: 05/25/2019 13:04     EKG: Independently reviewed.  Sinus rhythm, QTC 422, tachycardia,  nonspecific T wave change   Assessment/Plan Principal Problem:   Lymphopenia Active Problems:   Leg weakness, bilateral   Thrombocytopenia (HCC)   Tobacco abuse   CKD (chronic kidney disease), stage II   Lymphopenia and thrombocytopenia: WBC 1.3, platelet 95. Etiology is not clear. Oncologist, Dr. Mike Gip was consulted. Patient received 1 dose of vancomycin and Zosyn due to suspected infection in ED, but patient does not have fever, clinically does not seem to have a infection. Will not continue antibiotics. We will follow-up blood culture. -Placed on MedSurg bed for observation -Follow-up with CBC -LDH, peripheral smear -Follow-up oncologist recommendations  Leg weakness: Patient has bilateral leg weakness, reporting bilateral leg tightness, generalized weakness. Etiology is not clear. Given history of servical spinal stenosis, will get MRI for further evaluation and treatment. -MRI of cervical spine and lumbar spine  Tobacco abuse:  -nicotine patch  CKD (chronic kidney disease), stage II:  Cre 1.21 and BUN 15. Worsened than baseline. Baseline creatinine 1.02 recently -f/u by CBC -IV fluid: 1 L normal saline, followed by 100 cc/h    DVT ppx: SCD Code Status: Full code Family Communication: None at bed side.    Disposition Plan:  Anticipate discharge back to previous home environment Consults called: Oncology, Dr. Mike Gip Admission status: Med-surg bed for obs   Date of Service 05/25/2019    [facility]   If 7PM-7AM, please contact night-coverage [email or web address] Password [email or web address] 05/25/2019, 8:16 PM

## 2019-05-26 ENCOUNTER — Encounter: Payer: Self-pay | Admitting: Internal Medicine

## 2019-05-26 ENCOUNTER — Other Ambulatory Visit: Payer: Self-pay

## 2019-05-26 DIAGNOSIS — R531 Weakness: Secondary | ICD-10-CM | POA: Diagnosis present

## 2019-05-26 DIAGNOSIS — M79605 Pain in left leg: Secondary | ICD-10-CM | POA: Diagnosis present

## 2019-05-26 DIAGNOSIS — R000 Tachycardia, unspecified: Secondary | ICD-10-CM | POA: Diagnosis present

## 2019-05-26 DIAGNOSIS — M79604 Pain in right leg: Secondary | ICD-10-CM | POA: Diagnosis present

## 2019-05-26 DIAGNOSIS — N200 Calculus of kidney: Secondary | ICD-10-CM | POA: Diagnosis present

## 2019-05-26 DIAGNOSIS — R6889 Other general symptoms and signs: Secondary | ICD-10-CM | POA: Diagnosis not present

## 2019-05-26 DIAGNOSIS — N182 Chronic kidney disease, stage 2 (mild): Secondary | ICD-10-CM | POA: Diagnosis not present

## 2019-05-26 DIAGNOSIS — R251 Tremor, unspecified: Secondary | ICD-10-CM | POA: Diagnosis present

## 2019-05-26 DIAGNOSIS — D696 Thrombocytopenia, unspecified: Secondary | ICD-10-CM | POA: Diagnosis not present

## 2019-05-26 DIAGNOSIS — M4802 Spinal stenosis, cervical region: Secondary | ICD-10-CM | POA: Diagnosis present

## 2019-05-26 DIAGNOSIS — G8929 Other chronic pain: Secondary | ICD-10-CM | POA: Diagnosis present

## 2019-05-26 DIAGNOSIS — R600 Localized edema: Secondary | ICD-10-CM | POA: Diagnosis present

## 2019-05-26 DIAGNOSIS — I739 Peripheral vascular disease, unspecified: Secondary | ICD-10-CM | POA: Diagnosis present

## 2019-05-26 DIAGNOSIS — R29898 Other symptoms and signs involving the musculoskeletal system: Secondary | ICD-10-CM | POA: Diagnosis not present

## 2019-05-26 DIAGNOSIS — Z79899 Other long term (current) drug therapy: Secondary | ICD-10-CM | POA: Diagnosis not present

## 2019-05-26 DIAGNOSIS — Z20822 Contact with and (suspected) exposure to covid-19: Secondary | ICD-10-CM | POA: Diagnosis present

## 2019-05-26 DIAGNOSIS — E538 Deficiency of other specified B group vitamins: Secondary | ICD-10-CM | POA: Diagnosis present

## 2019-05-26 DIAGNOSIS — F17210 Nicotine dependence, cigarettes, uncomplicated: Secondary | ICD-10-CM | POA: Diagnosis present

## 2019-05-26 DIAGNOSIS — D72810 Lymphocytopenia: Secondary | ICD-10-CM | POA: Diagnosis not present

## 2019-05-26 LAB — BASIC METABOLIC PANEL
Anion gap: 6 (ref 5–15)
BUN: 21 mg/dL (ref 8–23)
CO2: 25 mmol/L (ref 22–32)
Calcium: 7.6 mg/dL — ABNORMAL LOW (ref 8.9–10.3)
Chloride: 112 mmol/L — ABNORMAL HIGH (ref 98–111)
Creatinine, Ser: 1.17 mg/dL (ref 0.61–1.24)
GFR calc Af Amer: >60 mL/min (ref >60)
GFR calc non Af Amer: >60 mL/min (ref >60)
Glucose, Bld: 96 mg/dL (ref 70–99)
Potassium: 3.8 mmol/L (ref 3.5–5.1)
Sodium: 143 mmol/L (ref 135–145)

## 2019-05-26 LAB — CBC WITH DIFFERENTIAL/PLATELET
Abs Immature Granulocytes: 0.01 10*3/uL (ref 0.00–0.07)
Basophils Absolute: 0 10*3/uL (ref 0.0–0.1)
Basophils Relative: 1 %
Eosinophils Absolute: 0.2 10*3/uL (ref 0.0–0.5)
Eosinophils Relative: 4 %
HCT: 40.1 % (ref 39.0–52.0)
Hemoglobin: 13.1 g/dL (ref 13.0–17.0)
Immature Granulocytes: 0 %
Lymphocytes Relative: 12 %
Lymphs Abs: 0.6 10*3/uL — ABNORMAL LOW (ref 0.7–4.0)
MCH: 29.5 pg (ref 26.0–34.0)
MCHC: 32.7 g/dL (ref 30.0–36.0)
MCV: 90.3 fL (ref 80.0–100.0)
Monocytes Absolute: 0.4 10*3/uL (ref 0.1–1.0)
Monocytes Relative: 8 %
Neutro Abs: 3.4 10*3/uL (ref 1.7–7.7)
Neutrophils Relative %: 75 %
Platelets: 74 10*3/uL — ABNORMAL LOW (ref 150–400)
RBC: 4.44 MIL/uL (ref 4.22–5.81)
RDW: 13.4 % (ref 11.5–15.5)
WBC: 4.6 10*3/uL (ref 4.0–10.5)
nRBC: 0 % (ref 0.0–0.2)

## 2019-05-26 LAB — CK: Total CK: 99 U/L (ref 49–397)

## 2019-05-26 LAB — FIBRINOGEN: Fibrinogen: 244 mg/dL (ref 210–475)

## 2019-05-26 LAB — HEPATITIS B CORE ANTIBODY, TOTAL: Hep B Core Total Ab: NONREACTIVE

## 2019-05-26 LAB — HEPATITIS C ANTIBODY: HCV Ab: NONREACTIVE

## 2019-05-26 LAB — VITAMIN B12: Vitamin B-12: 223 pg/mL (ref 180–914)

## 2019-05-26 LAB — FOLATE: Folate: 11.5 ng/mL (ref >5.9)

## 2019-05-26 MED ORDER — VITAMIN B-12 1000 MCG PO TABS
1000.0000 ug | ORAL_TABLET | Freq: Every day | ORAL | Status: DC
  Administered 2019-05-27: 10:00:00 1000 ug via ORAL
  Filled 2019-05-26: qty 1

## 2019-05-26 NOTE — Progress Notes (Signed)
Report given to 1A, patient transported to room.

## 2019-05-26 NOTE — Progress Notes (Signed)
[facility] Hematology/Oncology Progress Note  Date of admission: 05/25/2019  Hospital day:  05/26/2019  Chief Complaint: Martin [name] is a 64 y.o. male who was admitted with rigors, lymphopenia and thrombocytopenia.  Subjective:  He feels much better.  Rare chill.  Social History: The patient is alone today.  Allergies: No Known Allergies  Scheduled Medications: . gabapentin  600 mg Oral QHS  . nicotine  21 mg Transdermal Daily    Review of Systems: GENERAL:  Feels better.  No fevers, sweats or weight loss. PERFORMANCE STATUS (ECOG):  1 HEENT:  No visual changes, runny nose, sore throat, mouth sores or tenderness. Lungs: No shortness of breath or cough.  No hemoptysis. Cardiac:  No chest pain, palpitations, orthopnea, or PND. GI:  No nausea, vomiting, diarrhea, constipation, melena or hematochezia. GU:  No urgency, frequency, dysuria, or hematuria. Musculoskeletal:  Leg muscle feel better/less sore.  No back pain.  No joint pain.  Extremities:  No pain or swelling. Skin:  No rashes or skin changes. Neuro:  No headache, numbness or weakness, balance or coordination issues. Endocrine:  No diabetes, thyroid issues, hot flashes or night sweats. Psych:  No mood changes, depression or anxiety. Pain:  No focal pain. Review of systems:  All other systems reviewed and found to be negative.  Physical Exam: Blood pressure (!) 155/79, pulse 63, temperature 97.8 F (36.6 C), temperature source Oral, resp. rate 16, height 5\' 9"  (1.753 m), weight 198 lb (89.8 kg), SpO2 96 %.  GENERAL:  Well developed, well nourished, gentleman lying comfortably in the exam room in no acute distress. MENTAL STATUS:  Alert and oriented to person, place and time. HEAD:  Pearline Cables hair.  Male pattern baldness.  Normocephalic, atraumatic, face symmetric, no Cushingoid features. EYES:  Glasses.  Blue eyes.  Pupils equal round and reactive to light and accomodation.  No conjunctivitis or scleral  icterus. ENT:  Oropharynx clear without lesion.  Tongue normal. Mucous membranes moist.  RESPIRATORY:  Clear to auscultation without rales, wheezes or rhonchi. CARDIOVASCULAR:  Regular rate and rhythm without murmur, rub or gallop. ABDOMEN:  Soft, non-tender, with active bowel sounds, and no appreciable hepatosplenomegaly.  No masses. SKIN:  No rashes, ulcers or lesions. EXTREMITIES: Subtle right lower extremity edema noticeable at ankle.  No skin discoloration or tenderness.  No palpable cords. NEUROLOGICAL: Unremarkable. PSYCH:  Appropriate.   Results for orders placed or performed during the hospital encounter of 05/25/19 (from the past 48 hour(s))  CBC     Status: Abnormal   Collection Time: 05/25/19 10:01 AM  Result Value Ref Range   WBC 1.3 (LL) 4.0 - 10.5 K/uL    Comment: This critical result has verified and been called to [facility] by Vladimir Crofts on 01 19 2021 at 1121, and has been read back.    RBC 5.06 4.22 - 5.81 MIL/uL   Hemoglobin 15.2 13.0 - 17.0 g/dL   HCT 45.7 39.0 - 52.0 %   MCV 90.3 80.0 - 100.0 fL   MCH 30.0 26.0 - 34.0 pg   MCHC 33.3 30.0 - 36.0 g/dL   RDW 13.0 11.5 - 15.5 %   Platelets 94 (L) 150 - 400 K/uL    Comment: PLATELET COUNT CONFIRMED BY SMEAR Immature Platelet Fraction may be clinically indicated, consider ordering this additional test [id number]    nRBC 0.0 0.0 - 0.2 %    Comment: Performed at [facility], [address] [place], [place] [address]  Urinalysis, Complete  w Microscopic     Status: Abnormal   Collection Time: 05/25/19 10:01 AM  Result Value Ref Range   Color, Urine YELLOW (A) YELLOW   APPearance CLEAR (A) CLEAR   Specific Gravity, Urine 1.014 1.005 - 1.030   pH 5.0 5.0 - 8.0   Glucose, UA NEGATIVE NEGATIVE mg/dL   Hgb urine dipstick MODERATE (A) NEGATIVE   Bilirubin Urine NEGATIVE NEGATIVE   Ketones, ur NEGATIVE NEGATIVE mg/dL   Protein, ur NEGATIVE NEGATIVE mg/dL   Nitrite NEGATIVE NEGATIVE   Leukocytes,Ua  NEGATIVE NEGATIVE   RBC / HPF 0-5 0 - 5 RBC/hpf   WBC, UA 0-5 0 - 5 WBC/hpf   Bacteria, UA NONE SEEN NONE SEEN   Squamous Epithelial / LPF NONE SEEN 0 - 5   Mucus PRESENT     Comment: Performed at [facility], [address] [place], [place] [address]  Comprehensive metabolic panel     Status: Abnormal   Collection Time: 05/25/19 10:01 AM  Result Value Ref Range   Sodium 144 135 - 145 mmol/L   Potassium 4.4 3.5 - 5.1 mmol/L    Comment: HEMOLYSIS AT THIS LEVEL MAY AFFECT RESULT   Chloride 109 98 - 111 mmol/L   CO2 26 22 - 32 mmol/L   Glucose, Bld 103 (H) 70 - 99 mg/dL   BUN 15 8 - 23 mg/dL   Creatinine, Ser 1.21 0.61 - 1.24 mg/dL   Calcium 8.6 (L) 8.9 - 10.3 mg/dL   Total Protein 6.8 6.5 - 8.1 g/dL   Albumin 3.7 3.5 - 5.0 g/dL   AST 26 15 - 41 U/L    Comment: HEMOLYSIS AT THIS LEVEL MAY AFFECT RESULT   ALT 19 0 - 44 U/L   Alkaline Phosphatase 88 38 - 126 U/L   Total Bilirubin 1.2 0.3 - 1.2 mg/dL    Comment: HEMOLYSIS AT THIS LEVEL MAY AFFECT RESULT   GFR calc non Af Amer >60 >60 mL/min   GFR calc Af Amer >60 >60 mL/min   Anion gap 9 5 - 15    Comment: Performed at [facility], [address] [place], [place] [address]  CBC with Differential/Platelet     Status: Abnormal   Collection Time: 05/25/19 10:01 AM  Result Value Ref Range   WBC 1.3 (LL) 4.0 - 10.5 K/uL    Comment: REPEATED TO VERIFY THIS CRITICAL RESULT HAS VERIFIED AND BEEN CALLED TO KATE BUMGARNER BY HINA PATEL ON 01 19 2021 AT 1243, AND HAS BEEN READ BACK.  THIS CRITICAL RESULT HAS VERIFIED AND BEEN CALLED TO KATE BUMGARNER BY HINA PATEL ON 01 19 2021 AT 1244, AND HAS BEEN READ BACK.     RBC 5.05 4.22 - 5.81 MIL/uL   Hemoglobin 15.3 13.0 - 17.0 g/dL   HCT 45.6 39.0 - 52.0 %   MCV 90.3 80.0 - 100.0 fL   MCH 30.3 26.0 - 34.0 pg   MCHC 33.6 30.0 - 36.0 g/dL   RDW 13.0 11.5 - 15.5 %   Platelets 95 (L) 150 - 400 K/uL    Comment: PLATELET COUNT CONFIRMED BY SMEAR Immature Platelet Fraction  may be clinically indicated, consider ordering this additional test [id number]    nRBC 0.0 0.0 - 0.2 %   Neutrophils Relative % 78 %   Neutro Abs 1.0 (L) 1.7 - 7.7 K/uL   Lymphocytes Relative 18 %   Lymphs Abs 0.2 (L) 0.7 - 4.0 K/uL   Monocytes Relative 1 %  Monocytes Absolute 0.0 (L) 0.1 - 1.0 K/uL   Eosinophils Relative 1 %   Eosinophils Absolute 0.0 0.0 - 0.5 K/uL   Basophils Relative 1 %   Basophils Absolute 0.0 0.0 - 0.1 K/uL   Immature Granulocytes 1 %   Abs Immature Granulocytes 0.01 0.00 - 0.07 K/uL    Comment: Performed at [facility], [address], [place], [place] [address]  Pathologist smear review     Status: None   Collection Time: 05/25/19 10:01 AM  Result Value Ref Range   Path Review Blood smear is reviewed.     Comment: Absolute leukopenia with lymphopenia. ANC of 1.0k/uL. Normochomic, normocytic erythrocytes, with unremarkable morphology. Thrombocytopenia with rare giant platelets. The cause of the patient's cytopenias is unclear from morphologic review alone. Potential secondary causes of cytopenias should be considered, including nutritional deficiencies, medication/toxin exposure, viral illness, and/or autoimmune/rheumatologic disease. Reviewed by Kathi Simpers, M.D. Performed at [facility], [address] [place], [place] [address]   Respiratory Panel by RT PCR (Flu A&B, Covid) - Nasopharyngeal Swab     Status: None   Collection Time: 05/25/19 11:16 AM   Specimen: Nasopharyngeal Swab  Result Value Ref Range   SARS Coronavirus 2 by RT PCR NEGATIVE NEGATIVE    Comment: (NOTE) SARS-CoV-2 target nucleic acids are NOT DETECTED. The SARS-CoV-2 RNA is generally detectable in upper respiratoy specimens during the acute phase of infection. The lowest concentration of SARS-CoV-2 viral copies this assay can detect is 131 copies/mL. A negative result does not preclude SARS-Cov-2 infection and should not be used as the sole basis for  treatment or other patient management decisions. A negative result may occur with  improper specimen collection/handling, submission of specimen other than nasopharyngeal swab, presence of viral mutation(s) within the areas targeted by this assay, and inadequate number of viral copies (<131 copies/mL). A negative result must be combined with clinical observations, patient history, and epidemiological information. The expected result is Negative. Fact Sheet for Patients:  PinkCheek.be Fact Sheet for Healthcare Providers:  GravelBags.it This test is not yet ap proved or cleared by the [place] FDA and  has been authorized for detection and/or diagnosis of SARS-CoV-2 by FDA under an Emergency Use Authorization (EUA). This EUA will remain  in effect (meaning this test can be used) for the duration of the COVID-19 declaration under Section 564(b)(1) of the Act, 21 U.S.C. section 360bbb-3(b)(1), unless the authorization is terminated or revoked sooner.    Influenza A by PCR NEGATIVE NEGATIVE   Influenza B by PCR NEGATIVE NEGATIVE    Comment: (NOTE) The Xpert Xpress SARS-CoV-2/FLU/RSV assay is intended as an aid in  the diagnosis of influenza from Nasopharyngeal swab specimens and  should not be used as a sole basis for treatment. Nasal washings and  aspirates are unacceptable for Xpert Xpress SARS-CoV-2/FLU/RSV  testing. Fact Sheet for Patients: PinkCheek.be Fact Sheet for Healthcare Providers: GravelBags.it This test is not yet approved or cleared by the [place] FDA and  has been authorized for detection and/or diagnosis of SARS-CoV-2 by  FDA under an Emergency Use Authorization (EUA). This EUA will remain  in effect (meaning this test can be used) for the duration of the  Covid-19 declaration under Section 564(b)(1) of the Act, 21  U.S.C. section  360bbb-3(b)(1), unless the authorization is  terminated or revoked. Performed at [facility], [address] [place], [place] [address]   Blood culture (routine x 2)     Status: None (  Preliminary result)   Collection Time: 05/25/19 11:34 AM   Specimen: BLOOD  Result Value Ref Range   Specimen Description BLOOD BLOOD RIGHT HAND    Special Requests      BOTTLES DRAWN AEROBIC AND ANAEROBIC Blood Culture adequate volume   Culture      NO GROWTH < 24 HOURS Performed at [facility], [address] [place], [place] [address]    Report Status PENDING   Lactic acid, plasma     Status: Abnormal   Collection Time: 05/25/19 11:58 AM  Result Value Ref Range   Lactic Acid, Venous 2.3 (HH) 0.5 - 1.9 mmol/L    Comment: CRITICAL RESULT CALLED TO, READ BACK BY AND VERIFIED WITH KATE BUMGARNER AT 1240ON 05/25/2019 [place]. Performed at [facility], [address] [place], [place] [address]   Blood culture (routine x 2)     Status: None (Preliminary result)   Collection Time: 05/25/19 11:58 AM   Specimen: BLOOD  Result Value Ref Range   Specimen Description BLOOD RIGHT AC    Special Requests      BOTTLES DRAWN AEROBIC AND ANAEROBIC Blood Culture adequate volume   Culture      NO GROWTH < 24 HOURS Performed at [facility], [address] [place], [place] [address]    Report Status PENDING   C-reactive protein     Status: Abnormal   Collection Time: 05/25/19 11:58 AM  Result Value Ref Range   CRP 1.8 (H) <1.0 mg/dL    Comment: Performed at [facility], [place] [address], , [place] [address]  Lactate dehydrogenase     Status: Abnormal   Collection Time: 05/25/19  3:38 PM  Result Value Ref Range   LDH 313 (H) 98 - 192 U/L    Comment: Performed at [facility], [address] [place], [place] [address]  Save Smear     Status: None   Collection Time: 05/25/19  3:38 PM  Result Value Ref Range   Smear Review SMEAR STAINED AND  AVAILABLE FOR REVIEW     Comment: Performed at [facility], [address] [place], [place] [address]  HIV Antibody (routine testing w rflx)     Status: None   Collection Time: 05/25/19  4:59 PM  Result Value Ref Range   HIV Screen 4th Generation wRfx NON REACTIVE NON REACTIVE    Comment: Performed at [facility], [place] [address] [place], [place] [address]  Protime-INR     Status: Abnormal   Collection Time: 05/25/19  4:59 PM  Result Value Ref Range   Prothrombin Time 17.1 (H) 11.4 - 15.2 seconds   INR 1.4 (H) 0.8 - 1.2    Comment: (NOTE) INR goal varies based on device and disease states. Performed at [facility], [address] [place], [place] [address]   APTT     Status: Abnormal   Collection Time: 05/25/19  4:59 PM  Result Value Ref Range   aPTT 46 (H) 24 - 36 seconds    Comment:        IF BASELINE aPTT IS ELEVATED, SUGGEST PATIENT RISK ASSESSMENT BE USED TO DETERMINE APPROPRIATE ANTICOAGULANT THERAPY. Performed at [facility], [address] [place],  [id number]   Basic metabolic panel     Status: Abnormal   Collection Time: 05/26/19  5:10 AM  Result Value Ref Range   Sodium 143 135 - 145 mmol/L   Potassium 3.8 3.5 -  5.1 mmol/L   Chloride 112 (H) 98 - 111 mmol/L   CO2 25 22 - 32 mmol/L   Glucose, Bld 96 70 - 99 mg/dL   BUN 21 8 - 23 mg/dL   Creatinine, Ser 1.17 0.61 - 1.24 mg/dL   Calcium 7.6 (L) 8.9 - 10.3 mg/dL   GFR calc non Af Amer >60 >60 mL/min   GFR calc Af Amer >60 >60 mL/min   Anion gap 6 5 - 15    Comment: Performed at [facility], [address] [place], [place] [address]  Vitamin B12     Status: None   Collection Time: 05/26/19  5:10 AM  Result Value Ref Range   Vitamin B-12 223 180 - 914 pg/mL    Comment: (NOTE) This assay is not validated for testing neonatal or myeloproliferative syndrome specimens for Vitamin B12 levels. Performed at [facility], [place] [address]  [place], [place] [address]   Folate     Status: None   Collection Time: 05/26/19  5:10 AM  Result Value Ref Range   Folate 11.5 >5.9 ng/mL    Comment: Performed at [facility], [address] [place], [place] [address]  Hepatitis B core antibody, total     Status: None   Collection Time: 05/26/19  5:10 AM  Result Value Ref Range   Hep B Core Total Ab NON REACTIVE NON REACTIVE    Comment: Performed at [place] [address] [place], [place] [address]  Hepatitis C antibody     Status: None   Collection Time: 05/26/19  5:10 AM  Result Value Ref Range   HCV Ab NON REACTIVE NON REACTIVE    Comment: (NOTE) Nonreactive HCV antibody screen is consistent with no HCV infections,  unless recent infection is suspected or other evidence exists to indicate HCV infection. Performed at [facility], [place] [address], [place], [place] [address]   Fibrinogen     Status: None   Collection Time: 05/26/19  5:10 AM  Result Value Ref Range   Fibrinogen 244 210 - 475 mg/dL    Comment: Performed at [facility], [address] [place], [place] [address]  CK     Status: None   Collection Time: 05/26/19  5:10 AM  Result Value Ref Range   Total CK 99 49 - 397 U/L    Comment: Performed at [facility], [address] [place], [place] [address]  CBC with Differential     Status: Abnormal   Collection Time: 05/26/19  5:10 AM  Result Value Ref Range   WBC 4.6 4.0 - 10.5 K/uL    Comment: REPEATED TO VERIFY   RBC 4.44 4.22 - 5.81 MIL/uL   Hemoglobin 13.1 13.0 - 17.0 g/dL    Comment: REPEATED TO VERIFY   HCT 40.1 39.0 - 52.0 %   MCV 90.3 80.0 - 100.0 fL   MCH 29.5 26.0 - 34.0 pg   MCHC 32.7 30.0 - 36.0 g/dL   RDW 13.4 11.5 - 15.5 %   Platelets 74 (L) 150 - 400 K/uL    Comment: Immature Platelet Fraction may be clinically indicated, consider ordering this additional test [id number] CONSISTENT WITH PREVIOUS RESULT    nRBC 0.0 0.0 - 0.2 %   Neutrophils  Relative % 75 %   Neutro Abs 3.4 1.7 - 7.7 K/uL   Lymphocytes Relative 12 %   Lymphs Abs 0.6 (L) 0.7 - 4.0 K/uL  Monocytes Relative 8 %   Monocytes Absolute 0.4 0.1 - 1.0 K/uL   Eosinophils Relative 4 %   Eosinophils Absolute 0.2 0.0 - 0.5 K/uL   Basophils Relative 1 %   Basophils Absolute 0.0 0.0 - 0.1 K/uL   Immature Granulocytes 0 %   Abs Immature Granulocytes 0.01 0.00 - 0.07 K/uL    Comment: Performed at [facility], [address] [place], [place] [address]   DG Chest 1 View  Result Date: 05/25/2019 CLINICAL DATA:  Weakness EXAM: CHEST  1 VIEW COMPARISON:  06/09/2010 FINDINGS: Cardiac shadow is within normal limits. The lungs are well aerated bilaterally. No focal infiltrate or sizable effusion is seen. No bony abnormality is noted. Old healed left clavicular fracture is seen. IMPRESSION: No acute abnormality noted. Electronically Signed   By: Inez Catalina M.D.   On: 05/25/2019 13:04   MR CERVICAL SPINE W WO CONTRAST  Result Date: 05/25/2019 CLINICAL DATA:  Weakness with leg pain. History of cervical stenosis. Tremors. EXAM: MRI CERVICAL AND LUMBAR SPINE WITHOUT AND WITH CONTRAST TECHNIQUE: Multiplanar and multiecho pulse sequences of the cervical spine, to include the craniocervical junction and cervicothoracic junction, and lumbar spine, were obtained without and with intravenous contrast. CONTRAST:  44mL GADAVIST GADOBUTROL 1 MMOL/ML IV SOLN COMPARISON:  Cervical spine MRI 05/01/2012 FINDINGS: MRI CERVICAL SPINE FINDINGS Alignment: Physiologic. Vertebrae: No fracture, evidence of discitis, or bone lesion. Cord: Normal signal and morphology. Posterior Fossa, vertebral arteries, paraspinal tissues: Negative. Disc levels: C1-C2: Normal. C2-C3: Normal disc space and facets. No spinal canal or neuroforaminal stenosis. C3-C4: Normal disc space and facets. No spinal canal or neuroforaminal stenosis. C4-C5: Normal disc space and facets. No spinal canal or neuroforaminal stenosis.  C5-C6: Increased size of right subarticular disc protrusion with moderate right foraminal stenosis that is worsened from the prior study. There is flattening of the right ventral aspect of the spinal cord. No central spinal canal stenosis. There is mild left foraminal stenosis due to uncovertebral spurring. C6-C7: There is bilateral uncovertebral hypertrophy with severe bilateral foraminal stenosis, unchanged. No spinal canal stenosis. C7-T1: Normal disc space and facets. No spinal canal or neuroforaminal stenosis. MRI LUMBAR SPINE FINDINGS Segmentation:  Standard. Alignment:  Physiologic. Vertebrae:  Discogenic endplate signal changes at L5-S1. Conus medullaris and cauda equina: Conus extends to the L1 level. Conus and cauda equina appear normal. Paraspinal and other soft tissues: Negative Disc levels: T12-L1: Normal disc space and facets. No spinal canal or neuroforaminal stenosis. L1-L2: Normal disc space and facets. No spinal canal or neuroforaminal stenosis. L2-L3: Small extraforaminal left-sided disc protrusion without spinal canal or neural foraminal stenosis. L3-L4: Normal disc space and facets. No spinal canal or neuroforaminal stenosis. L4-L5: Disc desiccation with central and left foraminal protrusion components narrowing the left lateral recess with mild left foraminal stenosis. No central spinal canal stenosis or right foraminal stenosis. L5-S1: Disc space narrowing with intermediate sized disc osteophyte complex. There is narrowing of the left lateral recess and bilateral moderate foraminal stenosis. Visualized sacrum: Normal. IMPRESSION: 1. Increased size of right subarticular disc protrusion at C5-C6 with moderate right neural foraminal stenosis and flattening of the right ventral aspect of the spinal cord. No central spinal canal stenosis. 2. Unchanged severe bilateral foraminal stenosis at C6-C7 due to uncovertebral hypertrophy. 3. Left subarticular disc extrusion with inferior migration at L4-5  with narrowing of the left lateral recess and mild left foraminal stenosis. This could contribute to left L4 and L5 radiculopathy. 4. Moderate bilateral L5 neural foraminal  stenosis. Electronically Signed   By: Ulyses Jarred M.D.   On: 05/25/2019 23:38   MR Lumbar Spine W Wo Contrast  Result Date: 05/25/2019 CLINICAL DATA:  Weakness with leg pain. History of cervical stenosis. Tremors. EXAM: MRI CERVICAL AND LUMBAR SPINE WITHOUT AND WITH CONTRAST TECHNIQUE: Multiplanar and multiecho pulse sequences of the cervical spine, to include the craniocervical junction and cervicothoracic junction, and lumbar spine, were obtained without and with intravenous contrast. CONTRAST:  23mL GADAVIST GADOBUTROL 1 MMOL/ML IV SOLN COMPARISON:  Cervical spine MRI 05/01/2012 FINDINGS: MRI CERVICAL SPINE FINDINGS Alignment: Physiologic. Vertebrae: No fracture, evidence of discitis, or bone lesion. Cord: Normal signal and morphology. Posterior Fossa, vertebral arteries, paraspinal tissues: Negative. Disc levels: C1-C2: Normal. C2-C3: Normal disc space and facets. No spinal canal or neuroforaminal stenosis. C3-C4: Normal disc space and facets. No spinal canal or neuroforaminal stenosis. C4-C5: Normal disc space and facets. No spinal canal or neuroforaminal stenosis. C5-C6: Increased size of right subarticular disc protrusion with moderate right foraminal stenosis that is worsened from the prior study. There is flattening of the right ventral aspect of the spinal cord. No central spinal canal stenosis. There is mild left foraminal stenosis due to uncovertebral spurring. C6-C7: There is bilateral uncovertebral hypertrophy with severe bilateral foraminal stenosis, unchanged. No spinal canal stenosis. C7-T1: Normal disc space and facets. No spinal canal or neuroforaminal stenosis. MRI LUMBAR SPINE FINDINGS Segmentation:  Standard. Alignment:  Physiologic. Vertebrae:  Discogenic endplate signal changes at L5-S1. Conus medullaris and cauda  equina: Conus extends to the L1 level. Conus and cauda equina appear normal. Paraspinal and other soft tissues: Negative Disc levels: T12-L1: Normal disc space and facets. No spinal canal or neuroforaminal stenosis. L1-L2: Normal disc space and facets. No spinal canal or neuroforaminal stenosis. L2-L3: Small extraforaminal left-sided disc protrusion without spinal canal or neural foraminal stenosis. L3-L4: Normal disc space and facets. No spinal canal or neuroforaminal stenosis. L4-L5: Disc desiccation with central and left foraminal protrusion components narrowing the left lateral recess with mild left foraminal stenosis. No central spinal canal stenosis or right foraminal stenosis. L5-S1: Disc space narrowing with intermediate sized disc osteophyte complex. There is narrowing of the left lateral recess and bilateral moderate foraminal stenosis. Visualized sacrum: Normal. IMPRESSION: 1. Increased size of right subarticular disc protrusion at C5-C6 with moderate right neural foraminal stenosis and flattening of the right ventral aspect of the spinal cord. No central spinal canal stenosis. 2. Unchanged severe bilateral foraminal stenosis at C6-C7 due to uncovertebral hypertrophy. 3. Left subarticular disc extrusion with inferior migration at L4-5 with narrowing of the left lateral recess and mild left foraminal stenosis. This could contribute to left L4 and L5 radiculopathy. 4. Moderate bilateral L5 neural foraminal stenosis. Electronically Signed   By: Ulyses Jarred M.D.   On: 05/25/2019 23:38    Assessment:  Martin [name] is a 64 y.o. male with leukopenia and thrombocytopenia of unclear duration.  He presented with rigors and generalized weakness.  He denies any new medications or herbal products.  He denies alcohol use.  Peripheral smear revealed absolute leukopenia with lymphopenia.  RBCs were normochromic, normocytic with unremarkable morphology.  There were no schistocytes.  There were rare giant  platelets and no blasts.   He has B12 deficiency.  B12 was 223 on 05/25/2019.  He is on oral B12.  Symptomatically, he is feeling better.  Exam reveals no adenopathy or hepatosplenomegaly.  Plan:   1.   Leukopenia  Resolved.  WBC 1300 with an [place] 1000 on 05/25/2019.  WBC 4600 with an [place] 3400 on 05/26/2019.  Neutropenic precautions discontinued. 2.   Thrombocytopenia  Etiology unclear.             Peripheral smear reveals no platelet clumping.             HIV and COVID-19 negative.             PT 17.1 (INR 1.4) and PTT 46 (24-36).    Check PTT with mix.             B12 is 223 (low).   Begin oral B12 1000 mcg a day (orders written).  Normal studies: folate, hepatitis B and C testing, fibrinogen.  Pending studies:  ANA with reflex, copper level.  CBC in AM.  3.   Rigors             Etiology unclear.  No fever.  Broad spectrum antibiotics discontinued.   Patient being observed overnight.             CXR and urinalysis do not suggest infection.             Cultures negative to date.             CPK was normal. 4.   Right lower extremity edema  Subtle discrepancy in right lower extremity  Right lower extremity duplex. 5.   Disposition  Anticipate discharge home tomorrow and follow-up in outpatient clinic.     Lequita Asal, MD  05/26/2019, 8:52 PM

## 2019-05-26 NOTE — Progress Notes (Signed)
PROGRESS NOTE    Martin [name]  [id number] DOB: 1955-05-22 DOA: 05/25/2019  PCP: Dion Body, MD    LOS - 0   Brief Narrative:  64 y.o. male with medical history of kidney stone, cervical spinal stenosis, tobacco abuse, CKD3, who presented to ED on 1/19 with rigors, generalized weakness and bilateral leg pain/discomfort and weakness since day before.  Labs showed WBC 1.3 with lymphopenia, thrombocytopenia (95k), negative Flu A and B and Covid-19 (PCR).  Afebrile, tachycardic, tachypneic, without hypoxia.  Was treated with broad spectrum antibiotics in the ED and admitted with hematology (Dr. Mike Gip) consult for further evaluation.   Subjective 1/20: Patient reports still feeling very weak.  Still having leg pain/discomfort.  No fever/chills.  Otherwise feels okay.  No acute events reported.  Assessment & Plan:   Principal Problem:   Lymphopenia Active Problems:   Leg weakness, bilateral   Thrombocytopenia (HCC)   Tobacco abuse   CKD (chronic kidney disease), stage II   Lymphopenia and thrombocytopenia: WBC 1.3, platelet 95. Etiology is not clear. Oncologist, Dr. Mike Gip was consulted. Got Vanc/Zosyn in ED.  No fevers, no identified source of infection.  WBC is actually improved to 4.9 today.  Platelets lower however at 74. --monitor closely for signs of systemic infection --low threshhold to start antibiotics --hematology following and will closely f/u outpatient --monitor CBC --follow up peripheral smear and other labs ordered per heme  Leg weakness: Patient has bilateral leg weakness, reporting bilateral leg tightness, generalized weakness. Etiology is not clear. Given history of servical spinal stenosis, will get MRI for further evaluation and treatment. -MRI of cervical spine and lumbar spine unremarkable -PT evaluation  Tobacco abuse:  -nicotine patch  CKD (chronic kidney disease), stage II: Cr 1.21 and BUN 15. Baseline creatinine 1.02 recently.  Improved with IV hydration to 1.17. --monitor BMP --renally dose meds as indicated  DVT prophylaxis: SCD's   Code Status: Full Code  Family Communication: none at bedside  Disposition Plan:  Pending further evaluation and clinical improvement   Severity of Illness: The appropriate patient status for this patient is INPATIENT. Inpatient status is judged to be reasonable and necessary in order to provide the required intensity of service to ensure the patient's safety. The patient's presenting symptoms, and initial radiographic and laboratory data in the context of their chronic comorbidities is felt to place them at high risk for further clinical deterioration. Furthermore, it is not anticipated that the patient will be medically stable for discharge from the hospital within 2 midnights of admission. The following factors support the patient status of inpatient.    "           The patient's presenting symptoms include rigors, generalized weakness, bilateral leg pain. "           The initial radiographic and laboratory data are worrisome because of leukopenia with lymphopenia and thrombocytopenia concerning for a hematologic pathologic process that requires further evaluation. "           The chronic co-morbidities include cervical spine stenosis, degenerative disc disease, tobacco abuse.     * I certify that at the point of admission it is my clinical judgment that the patient will require inpatient hospital care spanning beyond 2 midnights from the point of admission due to high intensity of service, high risk for further deterioration and high frequency of surveillance required.*    Consultants:   Hematology, Dr. Mike Gip  Procedures:   none  Antimicrobials:   Vanc/Zosyn in  ED on 1/19    Objective: Vitals:   05/25/19 2006 05/26/19 0048 05/26/19 0437 05/26/19 1006  BP: (!) 141/84 109/65 114/71 (!) 153/88  Pulse: 67 (!) 59 (!) 57 (!) 57  Resp: 18 18 18 17   Temp:    98.2 F  (36.8 C)  TempSrc:    Oral  SpO2: 97% 97% 98% 98%  Weight:      Height:        Intake/Output Summary (Last 24 hours) at 05/26/2019 1359 Last data filed at 05/25/2019 1401 Gross per 24 hour  Intake 1000 ml  Output --  Net 1000 ml   Filed Weights   05/25/19 0958  Weight: 89.8 kg    Examination:  General exam: awake, alert, no acute distress HEENT: clear conjunctiva, anicteric sclera, moist mucus membranes, hearing grossly normal  Respiratory system: clear to auscultation bilaterally, no wheezes, rales or rhonchi, normal respiratory effort. Cardiovascular system: normal S1/S2, RRR, no JVD, murmurs, rubs, gallops, no pedal edema.   Gastrointestinal system: soft, non-tender, non-distended abdomen Central nervous system: alert and oriented x4. no gross focal neurologic deficits, normal speech Extremities: moves all, no cyanosis, normal tone Skin: dry, intact, normal temperature Psychiatry: normal mood, congruent affect, judgement and insight appear normal    Data Reviewed: I have personally reviewed following labs and imaging studies  CBC: Recent Labs  Lab 05/25/19 1001 05/26/19 0510  WBC 1.3*   1.3* 4.6  NEUTROABS 1.0* 3.4  HGB 15.3   15.2 13.1  HCT 45.6   45.7 40.1  MCV 90.3   90.3 90.3  PLT 95*   94* 74*   Basic Metabolic Panel: Recent Labs  Lab 05/25/19 1001 05/26/19 0510  NA 144 143  K 4.4 3.8  CL 109 112*  CO2 26 25  GLUCOSE 103* 96  BUN 15 21  CREATININE 1.21 1.17  CALCIUM 8.6* 7.6*   GFR: Estimated Creatinine Clearance: 73.4 mL/min (by C-G formula based on SCr of 1.17 mg/dL). Liver Function Tests: Recent Labs  Lab 05/25/19 1001  AST 26  ALT 19  ALKPHOS 88  BILITOT 1.2  PROT 6.8  ALBUMIN 3.7   No results for input(s): LIPASE, AMYLASE in the last 168 hours. No results for input(s): AMMONIA in the last 168 hours. Coagulation Profile: Recent Labs  Lab 05/25/19 1659  INR 1.4*   Cardiac Enzymes: Recent Labs  Lab 05/26/19 0510  CKTOTAL  99   BNP (last 3 results) No results for input(s): PROBNP in the last 8760 hours. HbA1C: No results for input(s): HGBA1C in the last 72 hours. CBG: No results for input(s): GLUCAP in the last 168 hours. Lipid Profile: No results for input(s): CHOL, HDL, LDLCALC, TRIG, CHOLHDL, LDLDIRECT in the last 72 hours. Thyroid Function Tests: No results for input(s): TSH, T4TOTAL, FREET4, T3FREE, THYROIDAB in the last 72 hours. Anemia Panel: Recent Labs    05/26/19 0510  VITAMINB12 223  FOLATE 11.5   Sepsis Labs: Recent Labs  Lab 05/25/19 1158  LATICACIDVEN 2.3*    Recent Results (from the past 240 hour(s))  Respiratory Panel by RT PCR (Flu A&B, Covid) - Nasopharyngeal Swab     Status: None   Collection Time: 05/25/19 11:16 AM   Specimen: Nasopharyngeal Swab  Result Value Ref Range Status   SARS Coronavirus 2 by RT PCR NEGATIVE NEGATIVE Final    Comment: (NOTE) SARS-CoV-2 target nucleic acids are NOT DETECTED. The SARS-CoV-2 RNA is generally detectable in upper respiratoy specimens during the acute phase of infection. The  lowest concentration of SARS-CoV-2 viral copies this assay can detect is 131 copies/mL. A negative result does not preclude SARS-Cov-2 infection and should not be used as the sole basis for treatment or other patient management decisions. A negative result may occur with  improper specimen collection/handling, submission of specimen other than nasopharyngeal swab, presence of viral mutation(s) within the areas targeted by this assay, and inadequate number of viral copies (<131 copies/mL). A negative result must be combined with clinical observations, patient history, and epidemiological information. The expected result is Negative. Fact Sheet for Patients:  PinkCheek.be Fact Sheet for Healthcare Providers:  GravelBags.it This test is not yet ap proved or cleared by the [place] FDA and  has been  authorized for detection and/or diagnosis of SARS-CoV-2 by FDA under an Emergency Use Authorization (EUA). This EUA will remain  in effect (meaning this test can be used) for the duration of the COVID-19 declaration under Section 564(b)(1) of the Act, 21 U.S.C. section 360bbb-3(b)(1), unless the authorization is terminated or revoked sooner.    Influenza A by PCR NEGATIVE NEGATIVE Final   Influenza B by PCR NEGATIVE NEGATIVE Final    Comment: (NOTE) The Xpert Xpress SARS-CoV-2/FLU/RSV assay is intended as an aid in  the diagnosis of influenza from Nasopharyngeal swab specimens and  should not be used as a sole basis for treatment. Nasal washings and  aspirates are unacceptable for Xpert Xpress SARS-CoV-2/FLU/RSV  testing. Fact Sheet for Patients: PinkCheek.be Fact Sheet for Healthcare Providers: GravelBags.it This test is not yet approved or cleared by the [place] FDA and  has been authorized for detection and/or diagnosis of SARS-CoV-2 by  FDA under an Emergency Use Authorization (EUA). This EUA will remain  in effect (meaning this test can be used) for the duration of the  Covid-19 declaration under Section 564(b)(1) of the Act, 21  U.S.C. section 360bbb-3(b)(1), unless the authorization is  terminated or revoked. Performed at [facility], [address] [place], [place] [address]   Blood culture (routine x 2)     Status: None (Preliminary result)   Collection Time: 05/25/19 11:34 AM   Specimen: BLOOD  Result Value Ref Range Status   Specimen Description BLOOD BLOOD RIGHT HAND  Final   Special Requests   Final    BOTTLES DRAWN AEROBIC AND ANAEROBIC Blood Culture adequate volume   Culture   Final    NO GROWTH < 24 HOURS Performed at [facility], [address] [place], [place] [address]    Report Status PENDING  Incomplete  Blood culture (routine x 2)     Status: None (Preliminary result)     Collection Time: 05/25/19 11:58 AM   Specimen: BLOOD  Result Value Ref Range Status   Specimen Description BLOOD RIGHT [facility]  Final   Special Requests   Final    BOTTLES DRAWN AEROBIC AND ANAEROBIC Blood Culture adequate volume   Culture   Final    NO GROWTH < 24 HOURS Performed at [facility], [address] [place], [place] [address]    Report Status PENDING  Incomplete         Radiology Studies: DG Chest 1 View  Result Date: 05/25/2019 CLINICAL DATA:  Weakness EXAM: CHEST  1 VIEW COMPARISON:  06/09/2010 FINDINGS: Cardiac shadow is within normal limits. The lungs are well aerated bilaterally. No focal infiltrate or sizable effusion is seen. No bony abnormality is noted. Old healed left clavicular fracture is seen. IMPRESSION: No acute abnormality noted.  Electronically Signed   By: Inez Catalina M.D.   On: 05/25/2019 13:04   MR CERVICAL SPINE W WO CONTRAST  Result Date: 05/25/2019 CLINICAL DATA:  Weakness with leg pain. History of cervical stenosis. Tremors. EXAM: MRI CERVICAL AND LUMBAR SPINE WITHOUT AND WITH CONTRAST TECHNIQUE: Multiplanar and multiecho pulse sequences of the cervical spine, to include the craniocervical junction and cervicothoracic junction, and lumbar spine, were obtained without and with intravenous contrast. CONTRAST:  33mL GADAVIST GADOBUTROL 1 MMOL/ML IV SOLN COMPARISON:  Cervical spine MRI 05/01/2012 FINDINGS: MRI CERVICAL SPINE FINDINGS Alignment: Physiologic. Vertebrae: No fracture, evidence of discitis, or bone lesion. Cord: Normal signal and morphology. Posterior Fossa, vertebral arteries, paraspinal tissues: Negative. Disc levels: C1-C2: Normal. C2-C3: Normal disc space and facets. No spinal canal or neuroforaminal stenosis. C3-C4: Normal disc space and facets. No spinal canal or neuroforaminal stenosis. C4-C5: Normal disc space and facets. No spinal canal or neuroforaminal stenosis. C5-C6: Increased size of right subarticular disc protrusion with  moderate right foraminal stenosis that is worsened from the prior study. There is flattening of the right ventral aspect of the spinal cord. No central spinal canal stenosis. There is mild left foraminal stenosis due to uncovertebral spurring. C6-C7: There is bilateral uncovertebral hypertrophy with severe bilateral foraminal stenosis, unchanged. No spinal canal stenosis. C7-T1: Normal disc space and facets. No spinal canal or neuroforaminal stenosis. MRI LUMBAR SPINE FINDINGS Segmentation:  Standard. Alignment:  Physiologic. Vertebrae:  Discogenic endplate signal changes at L5-S1. Conus medullaris and cauda equina: Conus extends to the L1 level. Conus and cauda equina appear normal. Paraspinal and other soft tissues: Negative Disc levels: T12-L1: Normal disc space and facets. No spinal canal or neuroforaminal stenosis. L1-L2: Normal disc space and facets. No spinal canal or neuroforaminal stenosis. L2-L3: Small extraforaminal left-sided disc protrusion without spinal canal or neural foraminal stenosis. L3-L4: Normal disc space and facets. No spinal canal or neuroforaminal stenosis. L4-L5: Disc desiccation with central and left foraminal protrusion components narrowing the left lateral recess with mild left foraminal stenosis. No central spinal canal stenosis or right foraminal stenosis. L5-S1: Disc space narrowing with intermediate sized disc osteophyte complex. There is narrowing of the left lateral recess and bilateral moderate foraminal stenosis. Visualized sacrum: Normal. IMPRESSION: 1. Increased size of right subarticular disc protrusion at C5-C6 with moderate right neural foraminal stenosis and flattening of the right ventral aspect of the spinal cord. No central spinal canal stenosis. 2. Unchanged severe bilateral foraminal stenosis at C6-C7 due to uncovertebral hypertrophy. 3. Left subarticular disc extrusion with inferior migration at L4-5 with narrowing of the left lateral recess and mild left foraminal  stenosis. This could contribute to left L4 and L5 radiculopathy. 4. Moderate bilateral L5 neural foraminal stenosis. Electronically Signed   By: Ulyses Jarred M.D.   On: 05/25/2019 23:38   MR Lumbar Spine W Wo Contrast  Result Date: 05/25/2019 CLINICAL DATA:  Weakness with leg pain. History of cervical stenosis. Tremors. EXAM: MRI CERVICAL AND LUMBAR SPINE WITHOUT AND WITH CONTRAST TECHNIQUE: Multiplanar and multiecho pulse sequences of the cervical spine, to include the craniocervical junction and cervicothoracic junction, and lumbar spine, were obtained without and with intravenous contrast. CONTRAST:  83mL GADAVIST GADOBUTROL 1 MMOL/ML IV SOLN COMPARISON:  Cervical spine MRI 05/01/2012 FINDINGS: MRI CERVICAL SPINE FINDINGS Alignment: Physiologic. Vertebrae: No fracture, evidence of discitis, or bone lesion. Cord: Normal signal and morphology. Posterior Fossa, vertebral arteries, paraspinal tissues: Negative. Disc levels: C1-C2: Normal. C2-C3: Normal disc space and facets. No spinal canal or neuroforaminal  stenosis. C3-C4: Normal disc space and facets. No spinal canal or neuroforaminal stenosis. C4-C5: Normal disc space and facets. No spinal canal or neuroforaminal stenosis. C5-C6: Increased size of right subarticular disc protrusion with moderate right foraminal stenosis that is worsened from the prior study. There is flattening of the right ventral aspect of the spinal cord. No central spinal canal stenosis. There is mild left foraminal stenosis due to uncovertebral spurring. C6-C7: There is bilateral uncovertebral hypertrophy with severe bilateral foraminal stenosis, unchanged. No spinal canal stenosis. C7-T1: Normal disc space and facets. No spinal canal or neuroforaminal stenosis. MRI LUMBAR SPINE FINDINGS Segmentation:  Standard. Alignment:  Physiologic. Vertebrae:  Discogenic endplate signal changes at L5-S1. Conus medullaris and cauda equina: Conus extends to the L1 level. Conus and cauda equina appear  normal. Paraspinal and other soft tissues: Negative Disc levels: T12-L1: Normal disc space and facets. No spinal canal or neuroforaminal stenosis. L1-L2: Normal disc space and facets. No spinal canal or neuroforaminal stenosis. L2-L3: Small extraforaminal left-sided disc protrusion without spinal canal or neural foraminal stenosis. L3-L4: Normal disc space and facets. No spinal canal or neuroforaminal stenosis. L4-L5: Disc desiccation with central and left foraminal protrusion components narrowing the left lateral recess with mild left foraminal stenosis. No central spinal canal stenosis or right foraminal stenosis. L5-S1: Disc space narrowing with intermediate sized disc osteophyte complex. There is narrowing of the left lateral recess and bilateral moderate foraminal stenosis. Visualized sacrum: Normal. IMPRESSION: 1. Increased size of right subarticular disc protrusion at C5-C6 with moderate right neural foraminal stenosis and flattening of the right ventral aspect of the spinal cord. No central spinal canal stenosis. 2. Unchanged severe bilateral foraminal stenosis at C6-C7 due to uncovertebral hypertrophy. 3. Left subarticular disc extrusion with inferior migration at L4-5 with narrowing of the left lateral recess and mild left foraminal stenosis. This could contribute to left L4 and L5 radiculopathy. 4. Moderate bilateral L5 neural foraminal stenosis. Electronically Signed   By: Ulyses Jarred M.D.   On: 05/25/2019 23:38        Scheduled Meds:  gabapentin  600 mg Oral QHS   nicotine  21 mg Transdermal Daily   Continuous Infusions:  sodium chloride 100 mL/hr at 05/26/19 1003     LOS: 0 days    Time spent: 35 minutes    Ezekiel Slocumb, DO [facility]   If 7PM-7AM, please contact night-coverage [email or web address] 05/26/2019, 1:59 PM

## 2019-05-26 NOTE — Evaluation (Signed)
Physical Therapy Evaluation Patient Details Name: Martin Martin [name] MRN: [id number] DOB: 06/19/1955 Today's Date: 05/26/2019   History of Present Illness  Martin Martin [name] is a 55yoM who [facility] on 1/19 Martin [name] generalized weakness and leg tightness. PMH: kidney stone, cervical spinal stenosis, tobacco abuse, CKD 3. Pt noted to have lymphopenia/thrombocytopenia of unclear etiology. Spine MRI pending.  Clinical Impression  Pt admitted with above diagnosis. Pt currently with functional limitations due to the deficits listed below (see "PT Problem List"). Upon entry, pt in bed, awake and agreeable to participate. The pt is alert and oriented x4, pleasant, conversational, and generally a good historian. Pt performs all basic mobility without instability of gait, without physical asssist, without device, but endorses significant weakness in legs. Pt Martin [name]/o thobbing pain in legs about the knee area irrespective of activity this date. Functional mobility assessment demonstrates increased effort/time requirements, poor tolerance, and need for physical assistance, whereas the patient performed these at a higher level of independence PTA. Pt denies need for DME at DC. Pt will benefit from skilled PT intervention to increase independence and safety with basic mobility in preparation for discharge to the venue listed below.       Follow Up Recommendations Outpatient PT    Equipment Recommendations  None recommended by PT    Recommendations for Other Services       Precautions / Restrictions Precautions Precautions: None Restrictions Weight Bearing Restrictions: No      Mobility  Bed Mobility Overal bed mobility: Independent                Transfers Overall transfer level: Independent                  Ambulation/Gait Ambulation/Gait assistance: Supervision Gait Distance (Feet): 200 Feet Assistive device: None   Gait velocity: 0.63m/s Gait velocity interpretation: <1.31 ft/sec,  indicative of household ambulator General Gait Details: slow and weak appearing; no frank instability of gait.  Stairs            Wheelchair Mobility    Modified Rankin (Stroke Patients Only)       Balance Overall balance assessment: Independent                                           Pertinent Vitals/Pain Pain Assessment: No/denies pain(throbbing BLE hips to knees)    Home Living Family/patient expects to be discharged to:: Private residence Living Arrangements: Spouse/significant other;Children Available Help at Discharge: Family Type of Home: House Home Access: Ramped entrance     Home Layout: One level Home Equipment: None      Prior Function Level of Independence: Independent         Comments: typically no baalcne problems.     Hand Dominance   Dominant Hand: Right    Extremity/Trunk Assessment   Upper Extremity Assessment Upper Extremity Assessment: Overall WFL for tasks assessed    Lower Extremity Assessment Lower Extremity Assessment: Overall WFL for tasks assessed;Generalized weakness       Communication   Communication: No difficulties  Cognition Arousal/Alertness: Awake/alert Behavior During Therapy: WFL for tasks assessed/performed Overall Cognitive Status: Within Functional Limits for tasks assessed  General Comments      Exercises     Assessment/Plan    PT Assessment Patient needs continued PT services  PT Problem List Decreased strength;Decreased activity tolerance;Decreased mobility       PT Treatment Interventions Functional mobility training;Therapeutic activities;Therapeutic exercise;Patient/family education    PT Goals (Current goals can be found in the Care Plan section)  Acute Rehab PT Goals Patient Stated Goal: regain strength and baseline AMB PT Goal Formulation: With patient Time For Goal Achievement: 06/09/19 Potential to Achieve  Goals: Good    Frequency Min 2X/week   Barriers to discharge        Co-evaluation               AM-PAC PT "6 Clicks" Mobility  Outcome Measure Help needed turning from your back to your side while in a flat bed without using bedrails?: None Help needed moving from lying on your back to sitting on the side of a flat bed without using bedrails?: None Help needed moving to and from a bed to a chair (including a wheelchair)?: None Help needed standing up from a chair using your arms (e.g., wheelchair or bedside chair)?: A Little Help needed to walk in hospital room?: A Little Help needed climbing 3-5 steps with a railing? : A Little 6 Click Score: 21    End of Session   Activity Tolerance: Patient tolerated treatment well;Patient limited by fatigue Patient left: in bed;with call bell/phone within reach Nurse Communication: Mobility status PT Visit Diagnosis: Difficulty in walking, not elsewhere classified (R26.2);Muscle weakness (generalized) (M62.81)    Time: [id number] PT Time Calculation (min) (ACUTE ONLY): 17 min   Charges:   PT Evaluation $PT Eval Low Complexity: 1 Low          4:37 PM, 05/26/19 Etta Grandchild, PT, DPT Physical Therapist - [facility]  [phone] ([place])    Martin Martin [name] 05/26/2019, 4:34 PM

## 2019-05-27 ENCOUNTER — Inpatient Hospital Stay: Payer: BC Managed Care – PPO

## 2019-05-27 LAB — COMPREHENSIVE METABOLIC PANEL
ALT: 19 U/L (ref 0–44)
AST: 17 U/L (ref 15–41)
Albumin: 3 g/dL — ABNORMAL LOW (ref 3.5–5.0)
Alkaline Phosphatase: 58 U/L (ref 38–126)
Anion gap: 7 (ref 5–15)
BUN: 14 mg/dL (ref 8–23)
CO2: 24 mmol/L (ref 22–32)
Calcium: 8 mg/dL — ABNORMAL LOW (ref 8.9–10.3)
Chloride: 111 mmol/L (ref 98–111)
Creatinine, Ser: 0.86 mg/dL (ref 0.61–1.24)
GFR calc Af Amer: >60 mL/min (ref >60)
GFR calc non Af Amer: >60 mL/min (ref >60)
Glucose, Bld: 97 mg/dL (ref 70–99)
Potassium: 4.1 mmol/L (ref 3.5–5.1)
Sodium: 142 mmol/L (ref 135–145)
Total Bilirubin: 0.9 mg/dL (ref 0.3–1.2)
Total Protein: 5.8 g/dL — ABNORMAL LOW (ref 6.5–8.1)

## 2019-05-27 LAB — CBC
HCT: 39.3 % (ref 39.0–52.0)
Hemoglobin: 13.2 g/dL (ref 13.0–17.0)
MCH: 30.1 pg (ref 26.0–34.0)
MCHC: 33.6 g/dL (ref 30.0–36.0)
MCV: 89.5 fL (ref 80.0–100.0)
Platelets: 70 10*3/uL — ABNORMAL LOW (ref 150–400)
RBC: 4.39 MIL/uL (ref 4.22–5.81)
RDW: 12.9 % (ref 11.5–15.5)
WBC: 3.4 10*3/uL — ABNORMAL LOW (ref 4.0–10.5)
nRBC: 0 % (ref 0.0–0.2)

## 2019-05-27 LAB — MAGNESIUM: Magnesium: 2 mg/dL (ref 1.7–2.4)

## 2019-05-27 MED ORDER — CYANOCOBALAMIN 1000 MCG PO TABS: 1000.0000 ug | ORAL_TABLET | Freq: Every day | ORAL | 1 refills | Status: AC

## 2019-05-27 MED ORDER — NICOTINE 21 MG/24HR TD PT24: 21.0000 mg | MEDICATED_PATCH | Freq: Every day | TRANSDERMAL | 0 refills | Status: AC

## 2019-05-27 NOTE — Progress Notes (Addendum)
D: Pt alert and oriented. Pt denies experiencing any pain at this time.   A: Pt received discharge and medication education/information. Pt belongings were gathered and taken with pt upon discharge.   R: Pt verbalized understanding of discharge and medication education/information.  Pt escorted to medical mall front lobby by staff where family picked pt up.  Pt knows to go to the walk in hours at the [facility] clinic for follow up until he has a PCP.

## 2019-05-27 NOTE — Plan of Care (Signed)

## 2019-05-27 NOTE — Discharge Summary (Addendum)
Physician Discharge Summary  Martin [name] [id number] DOB: [birth year] DOA: 05/25/2019  PCP: Dion Body, MD  Admit date: 05/25/2019 Discharge date: 05/27/2019  Admitted From: Home Disposition: Home  Recommendations for Outpatient Follow-up:  1. Follow up with PCP in 1-2 weeks 2. Please obtain BMP/CBC in one week 3. Patient will follow up with hematology in the next 1 week.  Discharge Condition: Stable CODE STATUS: Full code Diet recommendation: Heart healthy  Brief/Interim Summary: 64 year old male with history of cervical spinal stenosis, chronic kidney disease stage III, admitted to the hospital on 1/19 with rigors, generalized weakness and bilateral leg pain/discomfort.  He was noted to have a WBC count of 1.3 with lymphopenia, thrombocytopenia.  Flu test and COVID-19 test were negative.  He was afebrile, tachycardic.  He was treated with intravenous fluids and broad-spectrum antibiotics.  He did not have any obvious signs of infections.  Urinalysis did not show any signs of infection chest x-ray did not show any signs of pneumonia.  He was seen by hematology who ordered further work-up which ndicated B12 deficiency.  Overall WBC count did improve, but platelets remained low.  Patient was clinically improved and was not having any further symptoms.  It was felt reasonable to discharge the patient to continue follow-up with hematology as an outpatient.  He has been scheduled to follow-up with hematology in the next 1 week.  Regarding his leg pain/weakness.  MRI of the C-spine and L-spine did not show any acute changes.  Neurologically, he has 4-5 strength bilaterally in upper and lower extremities.  He is able to ambulate without difficulty.  Suspect some degree of weakness/neuropathy may be related to B12 deficiency.  Anticipate this should improve with replacement.  He does not have any significant back pain at this time.  Discharge Diagnoses:  Principal Problem:    Lymphopenia Active Problems:   Leg weakness, bilateral   Thrombocytopenia (HCC)   Tobacco abuse   CKD (chronic kidney disease), stage II    Discharge Instructions  Discharge Instructions    Diet - low sodium heart healthy   Complete by: As directed    Increase activity slowly   Complete by: As directed      Allergies as of 05/27/2019   No Known Allergies     Medication List    TAKE these medications   cyanocobalamin 1000 MCG tablet Take 1 tablet (1,000 mcg total) by mouth daily. Start taking on: May 28, 2019   gabapentin 300 MG capsule Commonly known as: NEURONTIN Take 600 mg by mouth at bedtime.   nicotine 21 mg/24hr patch Commonly known as: NICODERM CQ - dosed in mg/24 hours Place 1 patch (21 mg total) onto the skin daily. Start taking on: May 28, 2019 Notes to patient: Not given during this hospitalization      Follow-up Information    Lequita Asal, MD Follow up on 06/03/2019.   Specialty: Hematology and Oncology Why: 3:00pm  If you have increased bruising or bleeding please call [phone] for a sooner appointment Contact information: [place] [address] [phone]        Dion Body, MD. Schedule an appointment as soon as possible for a visit in 2 week(s).   Specialty: Family Medicine Contact information: [address] [place] [address] [phone]          No Known Allergies  Consultations:  Hematology   Procedures/Studies: DG Chest 1 View  Result Date: 05/25/2019 CLINICAL DATA:  Weakness EXAM: CHEST  1 VIEW COMPARISON:  06/09/2010 FINDINGS: Cardiac shadow is within normal limits. The lungs are well aerated bilaterally. No focal infiltrate or sizable effusion is seen. No bony abnormality is noted. Old healed left clavicular fracture is seen. IMPRESSION: No acute abnormality noted. Electronically Signed   By: Inez Catalina M.D.   On: 05/25/2019 13:04   MR  CERVICAL SPINE W WO CONTRAST  Result Date: 05/25/2019 CLINICAL DATA:  Weakness with leg pain. History of cervical stenosis. Tremors. EXAM: MRI CERVICAL AND LUMBAR SPINE WITHOUT AND WITH CONTRAST TECHNIQUE: Multiplanar and multiecho pulse sequences of the cervical spine, to include the craniocervical junction and cervicothoracic junction, and lumbar spine, were obtained without and with intravenous contrast. CONTRAST:  87mL GADAVIST GADOBUTROL 1 MMOL/ML IV SOLN COMPARISON:  Cervical spine MRI 05/01/2012 FINDINGS: MRI CERVICAL SPINE FINDINGS Alignment: Physiologic. Vertebrae: No fracture, evidence of discitis, or bone lesion. Cord: Normal signal and morphology. Posterior Fossa, vertebral arteries, paraspinal tissues: Negative. Disc levels: C1-C2: Normal. C2-C3: Normal disc space and facets. No spinal canal or neuroforaminal stenosis. C3-C4: Normal disc space and facets. No spinal canal or neuroforaminal stenosis. C4-C5: Normal disc space and facets. No spinal canal or neuroforaminal stenosis. C5-C6: Increased size of right subarticular disc protrusion with moderate right foraminal stenosis that is worsened from the prior study. There is flattening of the right ventral aspect of the spinal cord. No central spinal canal stenosis. There is mild left foraminal stenosis due to uncovertebral spurring. C6-C7: There is bilateral uncovertebral hypertrophy with severe bilateral foraminal stenosis, unchanged. No spinal canal stenosis. C7-T1: Normal disc space and facets. No spinal canal or neuroforaminal stenosis. MRI LUMBAR SPINE FINDINGS Segmentation:  Standard. Alignment:  Physiologic. Vertebrae:  Discogenic endplate signal changes at L5-S1. Conus medullaris and cauda equina: Conus extends to the L1 level. Conus and cauda equina appear normal. Paraspinal and other soft tissues: Negative Disc levels: T12-L1: Normal disc space and facets. No spinal canal or neuroforaminal stenosis. L1-L2: Normal disc space and facets. No  spinal canal or neuroforaminal stenosis. L2-L3: Small extraforaminal left-sided disc protrusion without spinal canal or neural foraminal stenosis. L3-L4: Normal disc space and facets. No spinal canal or neuroforaminal stenosis. L4-L5: Disc desiccation with central and left foraminal protrusion components narrowing the left lateral recess with mild left foraminal stenosis. No central spinal canal stenosis or right foraminal stenosis. L5-S1: Disc space narrowing with intermediate sized disc osteophyte complex. There is narrowing of the left lateral recess and bilateral moderate foraminal stenosis. Visualized sacrum: Normal. IMPRESSION: 1. Increased size of right subarticular disc protrusion at C5-C6 with moderate right neural foraminal stenosis and flattening of the right ventral aspect of the spinal cord. No central spinal canal stenosis. 2. Unchanged severe bilateral foraminal stenosis at C6-C7 due to uncovertebral hypertrophy. 3. Left subarticular disc extrusion with inferior migration at L4-5 with narrowing of the left lateral recess and mild left foraminal stenosis. This could contribute to left L4 and L5 radiculopathy. 4. Moderate bilateral L5 neural foraminal stenosis. Electronically Signed   By: Ulyses Jarred M.D.   On: 05/25/2019 23:38   MR Lumbar Spine W Wo Contrast  Result Date: 05/25/2019 CLINICAL DATA:  Weakness with leg pain. History of cervical stenosis. Tremors. EXAM: MRI CERVICAL AND LUMBAR SPINE WITHOUT AND WITH CONTRAST TECHNIQUE: Multiplanar and multiecho pulse sequences of the cervical spine, to include the craniocervical junction and cervicothoracic junction, and lumbar spine, were obtained without and with intravenous contrast. CONTRAST:  70mL GADAVIST GADOBUTROL 1 MMOL/ML IV SOLN COMPARISON:  Cervical spine MRI 05/01/2012 FINDINGS: MRI CERVICAL SPINE FINDINGS Alignment: Physiologic. Vertebrae: No fracture, evidence of discitis, or bone lesion. Cord: Normal signal and morphology. Posterior  Fossa, vertebral arteries, paraspinal tissues: Negative. Disc levels: C1-C2: Normal. C2-C3: Normal disc space and facets. No spinal canal or neuroforaminal stenosis. C3-C4: Normal disc space and facets. No spinal canal or neuroforaminal stenosis. C4-C5: Normal disc space and facets. No spinal canal or neuroforaminal stenosis. C5-C6: Increased size of right subarticular disc protrusion with moderate right foraminal stenosis that is worsened from the prior study. There is flattening of the right ventral aspect of the spinal cord. No central spinal canal stenosis. There is mild left foraminal stenosis due to uncovertebral spurring. C6-C7: There is bilateral uncovertebral hypertrophy with severe bilateral foraminal stenosis, unchanged. No spinal canal stenosis. C7-T1: Normal disc space and facets. No spinal canal or neuroforaminal stenosis. MRI LUMBAR SPINE FINDINGS Segmentation:  Standard. Alignment:  Physiologic. Vertebrae:  Discogenic endplate signal changes at L5-S1. Conus medullaris and cauda equina: Conus extends to the L1 level. Conus and cauda equina appear normal. Paraspinal and other soft tissues: Negative Disc levels: T12-L1: Normal disc space and facets. No spinal canal or neuroforaminal stenosis. L1-L2: Normal disc space and facets. No spinal canal or neuroforaminal stenosis. L2-L3: Small extraforaminal left-sided disc protrusion without spinal canal or neural foraminal stenosis. L3-L4: Normal disc space and facets. No spinal canal or neuroforaminal stenosis. L4-L5: Disc desiccation with central and left foraminal protrusion components narrowing the left lateral recess with mild left foraminal stenosis. No central spinal canal stenosis or right foraminal stenosis. L5-S1: Disc space narrowing with intermediate sized disc osteophyte complex. There is narrowing of the left lateral recess and bilateral moderate foraminal stenosis. Visualized sacrum: Normal. IMPRESSION: 1. Increased size of right subarticular  disc protrusion at C5-C6 with moderate right neural foraminal stenosis and flattening of the right ventral aspect of the spinal cord. No central spinal canal stenosis. 2. Unchanged severe bilateral foraminal stenosis at C6-C7 due to uncovertebral hypertrophy. 3. Left subarticular disc extrusion with inferior migration at L4-5 with narrowing of the left lateral recess and mild left foraminal stenosis. This could contribute to left L4 and L5 radiculopathy. 4. Moderate bilateral L5 neural foraminal stenosis. Electronically Signed   By: Ulyses Jarred M.D.   On: 05/25/2019 23:38   US Venous Img Lower Unilateral Right (DVT)  Result Date: 05/27/2019 CLINICAL DATA:  Right lower extremity pain and edema. EXAM: RIGHT LOWER EXTREMITY VENOUS DOPPLER ULTRASOUND TECHNIQUE: Gray-scale sonography with graded compression, as well as color Doppler and duplex ultrasound were performed to evaluate the lower extremity deep venous systems from the level of the common femoral vein and including the common femoral, femoral, profunda femoral, popliteal and calf veins including the posterior tibial, peroneal and gastrocnemius veins when visible. The superficial great saphenous vein was also interrogated. Spectral Doppler was utilized to evaluate flow at rest and with distal augmentation maneuvers in the common femoral, femoral and popliteal veins. COMPARISON:  None. FINDINGS: Contralateral Common Femoral Vein: Respiratory phasicity is normal and symmetric with the symptomatic side. No evidence of thrombus. Normal compressibility. Common Femoral Vein: No evidence of thrombus. Normal compressibility, respiratory phasicity and response to augmentation. Saphenofemoral Junction: No evidence of thrombus. Normal compressibility and flow on color Doppler imaging. Profunda Femoral Vein: No evidence of thrombus. Normal compressibility and flow on color Doppler imaging. Femoral Vein: No evidence of thrombus. Normal compressibility, respiratory  phasicity and response to augmentation. Popliteal Vein: No evidence of thrombus. Normal compressibility, respiratory phasicity and  response to augmentation. Calf Veins: No evidence of thrombus. Normal compressibility and flow on color Doppler imaging. Superficial Great Saphenous Vein: No evidence of thrombus. Normal compressibility. Venous Reflux:  None. Other Findings: No evidence of superficial thrombophlebitis or abnormal fluid collection. IMPRESSION: No evidence of right lower extremity deep venous thrombosis. Electronically Signed   By: Aletta Edouard M.D.   On: 05/27/2019 08:53       Subjective: Feels better.  Overall generalized weakness is better.  No back pain.  Discharge Exam: Vitals:   05/27/19 0853 05/27/19 1534 05/27/19 1622 05/27/19 1747  BP: (!) 160/89 (!) 159/80  (!) 170/89  Pulse: (!) 58 (!) 55 (!) 52 (!) 54  Resp: 18 20 18 17   Temp: 97.6 F (36.4 C) 98 F (36.7 C) 98 F (36.7 C) 97.6 F (36.4 C)  TempSrc: Oral Oral Oral Oral  SpO2: 97% 99% 99% 99%  Weight:      Height:        General: Pt is alert, awake, not in acute distress Cardiovascular: RRR, S1/S2 +, no rubs, no gallops Respiratory: CTA bilaterally, no wheezing, no rhonchi Abdominal: Soft, NT, ND, bowel sounds + Extremities: no edema, no cyanosis    The results of significant diagnostics from this hospitalization (including imaging, microbiology, ancillary and laboratory) are listed below for reference.     Microbiology: Recent Results (from the past 240 hour(s))  Respiratory Panel by RT PCR (Flu A&B, Covid) - Nasopharyngeal Swab     Status: None   Collection Time: 05/25/19 11:16 AM   Specimen: Nasopharyngeal Swab  Result Value Ref Range Status   SARS Coronavirus 2 by RT PCR NEGATIVE NEGATIVE Final    Comment: (NOTE) SARS-CoV-2 target nucleic acids are NOT DETECTED. The SARS-CoV-2 RNA is generally detectable in upper respiratoy specimens during the acute phase of infection. The  lowest concentration of SARS-CoV-2 viral copies this assay can detect is 131 copies/mL. A negative result does not preclude SARS-Cov-2 infection and should not be used as the sole basis for treatment or other patient management decisions. A negative result may occur with  improper specimen collection/handling, submission of specimen other than nasopharyngeal swab, presence of viral mutation(s) within the areas targeted by this assay, and inadequate number of viral copies (<131 copies/mL). A negative result must be combined with clinical observations, patient history, and epidemiological information. The expected result is Negative. Fact Sheet for Patients:  PinkCheek.be Fact Sheet for Healthcare Providers:  GravelBags.it This test is not yet ap proved or cleared by the [place] FDA and  has been authorized for detection and/or diagnosis of SARS-CoV-2 by FDA under an Emergency Use Authorization (EUA). This EUA will remain  in effect (meaning this test can be used) for the duration of the COVID-19 declaration under Section 564(b)(1) of the Act, 21 U.S.C. section 360bbb-3(b)(1), unless the authorization is terminated or revoked sooner.    Influenza A by PCR NEGATIVE NEGATIVE Final   Influenza B by PCR NEGATIVE NEGATIVE Final    Comment: (NOTE) The Xpert Xpress SARS-CoV-2/FLU/RSV assay is intended as an aid in  the diagnosis of influenza from Nasopharyngeal swab specimens and  should not be used as a sole basis for treatment. Nasal washings and  aspirates are unacceptable for Xpert Xpress SARS-CoV-2/FLU/RSV  testing. Fact Sheet for Patients: PinkCheek.be Fact Sheet for Healthcare Providers: GravelBags.it This test is not yet approved or cleared by the [place] FDA and  has been authorized for detection and/or diagnosis of SARS-CoV-2 by  FDA under  an Emergency  Use Authorization (EUA). This EUA will remain  in effect (meaning this test can be used) for the duration of the  Covid-19 declaration under Section 564(b)(1) of the Act, 21  U.S.C. section 360bbb-3(b)(1), unless the authorization is  terminated or revoked. Performed at [facility], [address] [place], [place] [address]   Blood culture (routine x 2)     Status: None (Preliminary result)   Collection Time: 05/25/19 11:34 AM   Specimen: BLOOD  Result Value Ref Range Status   Specimen Description BLOOD BLOOD RIGHT HAND  Final   Special Requests   Final    BOTTLES DRAWN AEROBIC AND ANAEROBIC Blood Culture adequate volume   Culture   Final    NO GROWTH 2 DAYS Performed at [facility], [address] [place], [place] [address]    Report Status PENDING  Incomplete  Blood culture (routine x 2)     Status: None (Preliminary result)   Collection Time: 05/25/19 11:58 AM   Specimen: BLOOD  Result Value Ref Range Status   Specimen Description BLOOD RIGHT [facility]  Final   Special Requests   Final    BOTTLES DRAWN AEROBIC AND ANAEROBIC Blood Culture adequate volume   Culture   Final    NO GROWTH 2 DAYS Performed at [facility], [address], [place], [place] [address]    Report Status PENDING  Incomplete     Labs: BNP (last 3 results) No results for input(s): BNP in the last 8760 hours. Basic Metabolic Panel: Recent Labs  Lab 05/25/19 1001 05/26/19 0510 05/27/19 0630  NA 144 143 142  K 4.4 3.8 4.1  CL 109 112* 111  CO2 26 25 24   GLUCOSE 103* 96 97  BUN 15 21 14   CREATININE 1.21 1.17 0.86  CALCIUM 8.6* 7.6* 8.0*  MG  --   --  2.0   Liver Function Tests: Recent Labs  Lab 05/25/19 1001 05/27/19 0630  AST 26 17  ALT 19 19  ALKPHOS 88 58  BILITOT 1.2 0.9  PROT 6.8 5.8*  ALBUMIN 3.7 3.0*   No results for input(s): LIPASE, AMYLASE in the last 168 hours. No results for input(s): AMMONIA in the last 168 hours. CBC: Recent Labs  Lab  05/25/19 1001 05/26/19 0510 05/27/19 0630  WBC 1.3*  1.3* 4.6 3.4*  NEUTROABS 1.0* 3.4  --   HGB 15.3  15.2 13.1 13.2  HCT 45.6  45.7 40.1 39.3  MCV 90.3  90.3 90.3 89.5  PLT 95*  94* 74* 70*   Cardiac Enzymes: Recent Labs  Lab 05/26/19 0510  CKTOTAL 99   BNP: Invalid input(s): POCBNP CBG: No results for input(s): GLUCAP in the last 168 hours. D-Dimer No results for input(s): DDIMER in the last 72 hours. Hgb A1c No results for input(s): HGBA1C in the last 72 hours. Lipid Profile No results for input(s): CHOL, HDL, LDLCALC, TRIG, CHOLHDL, LDLDIRECT in the last 72 hours. Thyroid function studies No results for input(s): TSH, T4TOTAL, T3FREE, THYROIDAB in the last 72 hours.  Invalid input(s): FREET3 Anemia work up Recent Labs    05/26/19 0510  VITAMINB12 223  FOLATE 11.5   Urinalysis    Component Value Date/Time   COLORURINE YELLOW (A) 05/25/2019 1001   APPEARANCEUR CLEAR (A) 05/25/2019 1001   APPEARANCEUR Clear 07/07/2015 1012   LABSPEC 1.014 05/25/2019 1001   PHURINE 5.0 05/25/2019 1001   GLUCOSEU NEGATIVE 05/25/2019 1001   HGBUR MODERATE (A) 05/25/2019 1001  BILIRUBINUR NEGATIVE 05/25/2019 1001   BILIRUBINUR Negative 07/07/2015 1012   KETONESUR NEGATIVE 05/25/2019 1001   PROTEINUR NEGATIVE 05/25/2019 1001   NITRITE NEGATIVE 05/25/2019 1001   LEUKOCYTESUR NEGATIVE 05/25/2019 1001   Sepsis Labs Invalid input(s): PROCALCITONIN,  WBC,  LACTICIDVEN Microbiology Recent Results (from the past 240 hour(s))  Respiratory Panel by RT PCR (Flu A&B, Covid) - Nasopharyngeal Swab     Status: None   Collection Time: 05/25/19 11:16 AM   Specimen: Nasopharyngeal Swab  Result Value Ref Range Status   SARS Coronavirus 2 by RT PCR NEGATIVE NEGATIVE Final    Comment: (NOTE) SARS-CoV-2 target nucleic acids are NOT DETECTED. The SARS-CoV-2 RNA is generally detectable in upper respiratoy specimens during the acute phase of infection. The lowest concentration of  SARS-CoV-2 viral copies this assay can detect is 131 copies/mL. A negative result does not preclude SARS-Cov-2 infection and should not be used as the sole basis for treatment or other patient management decisions. A negative result may occur with  improper specimen collection/handling, submission of specimen other than nasopharyngeal swab, presence of viral mutation(s) within the areas targeted by this assay, and inadequate number of viral copies (<131 copies/mL). A negative result must be combined with clinical observations, patient history, and epidemiological information. The expected result is Negative. Fact Sheet for Patients:  PinkCheek.be Fact Sheet for Healthcare Providers:  GravelBags.it This test is not yet ap proved or cleared by the [place] FDA and  has been authorized for detection and/or diagnosis of SARS-CoV-2 by FDA under an Emergency Use Authorization (EUA). This EUA will remain  in effect (meaning this test can be used) for the duration of the COVID-19 declaration under Section 564(b)(1) of the Act, 21 U.S.C. section 360bbb-3(b)(1), unless the authorization is terminated or revoked sooner.    Influenza A by PCR NEGATIVE NEGATIVE Final   Influenza B by PCR NEGATIVE NEGATIVE Final    Comment: (NOTE) The Xpert Xpress SARS-CoV-2/FLU/RSV assay is intended as an aid in  the diagnosis of influenza from Nasopharyngeal swab specimens and  should not be used as a sole basis for treatment. Nasal washings and  aspirates are unacceptable for Xpert Xpress SARS-CoV-2/FLU/RSV  testing. Fact Sheet for Patients: PinkCheek.be Fact Sheet for Healthcare Providers: GravelBags.it This test is not yet approved or cleared by the [place] FDA and  has been authorized for detection and/or diagnosis of SARS-CoV-2 by  FDA under an Emergency Use Authorization (EUA).  This EUA will remain  in effect (meaning this test can be used) for the duration of the  Covid-19 declaration under Section 564(b)(1) of the Act, 21  U.S.C. section 360bbb-3(b)(1), unless the authorization is  terminated or revoked. Performed at [facility], [address] [place], [place] [address]   Blood culture (routine x 2)     Status: None (Preliminary result)   Collection Time: 05/25/19 11:34 AM   Specimen: BLOOD  Result Value Ref Range Status   Specimen Description BLOOD BLOOD RIGHT HAND  Final   Special Requests   Final    BOTTLES DRAWN AEROBIC AND ANAEROBIC Blood Culture adequate volume   Culture   Final    NO GROWTH 2 DAYS Performed at [facility], [address], [place], [place] [address]    Report Status PENDING  Incomplete  Blood culture (routine x 2)     Status: None (Preliminary result)   Collection Time: 05/25/19 11:58 AM   Specimen: BLOOD  Result Value Ref Range Status   Specimen  Description BLOOD RIGHT AC  Final   Special Requests   Final    BOTTLES DRAWN AEROBIC AND ANAEROBIC Blood Culture adequate volume   Culture   Final    NO GROWTH 2 DAYS Performed at [facility], [address] [place], [place] [address]    Report Status PENDING  Incomplete     Time coordinating discharge: 25mins  SIGNED:   Kathie Dike, MD  [facility] 05/27/2019, 9:58 PM   If 7PM-7AM, please contact night-coverage [email or web address]

## 2019-05-28 LAB — PTT FACTOR INHIBITOR (MIXING STUDY): aPTT: 30.2 s (ref 22.9–30.2)

## 2019-05-28 LAB — ANA W/REFLEX: Anti Nuclear Antibody (ANA): NEGATIVE

## 2019-05-30 LAB — CULTURE, BLOOD (ROUTINE X 2)
Culture: NO GROWTH
Culture: NO GROWTH
Special Requests: ADEQUATE
Special Requests: ADEQUATE

## 2019-05-31 LAB — COPPER, SERUM: Copper: 80 ug/dL (ref 69–132)

## 2019-06-02 ENCOUNTER — Encounter: Payer: Self-pay | Admitting: Hematology and Oncology

## 2019-06-02 ENCOUNTER — Other Ambulatory Visit: Payer: Self-pay

## 2019-06-02 NOTE — Progress Notes (Signed)
No new changes noted today. The patient Name and DOB has been verified by phone today. 

## 2019-06-02 NOTE — Progress Notes (Signed)
[facility]  [address] [place], [place] [address] Phone: [phone]  Fax: [phone]   Clinic Day:  06/03/2019  Referring physician: Dion Body, MD  Chief Complaint: Martin [name] is a 64 y.o. male with leukopenia and thrombocytopenia who is seen for assessment after recent hospitalization   HPI:  The patient denies any prior history of low blood counts.  He does not have a primary care physician.  He has been followed by Dr Sharlet Salina for right C7 radiculopathy and treated with gabapentin.  He was admitted to [facility] from 05/25/2019 - 05/27/2019 with rigors, lymphopenia and thrombocytopenia.  Approximately 3 weeks prior to admission, he described a 3 day history of shaking chills.  He denied any fevers or sweats.  The event resolved spontaneously.  He did not seek medical attention.  The patient was at work on 05/25/2019 when he described "not feeling right", his hand shaking, then his legs getting weak.  He denied any sweats.  He describes a sensation of a "vice on my legs".  He called 911.  Symptoms were similar to the event 3 weeks prior except for the sensation in his legs.  He denies any precipitating event.  He denies any new medication or herbal product.  He does not drink quinine water.  He denies any exposures to radiation or toxins.  He denies any tick or insect bites.  He had not traveled outside of the [place].  He presented to the [facility] ER.  He was afebrile (98.8).  CBC revealed a hematocrit of 45.6, hemoglobin 15.3, MCV 90.3, platelets 95,000, WBC 1300 with an ANC of 1000.  Differential was unremarkable.  Creatinine was 1.21.  Bilirubin was 1.2.  Lactic acid was 2.3 (0.5 - 1.9).  LDH was 313 (98-192).  Urinalysis revealed moderate hemoglobin and no WBC and no RBCs.  PT was 17.1 (INR 1.4) and PTT 46 (24-36).  HIV negative.  COVID-19 testing was negative.  CXR revealed no acute abnormality.  Peripheral smear revealed absolute  leukopenia with lymphopenia.  RBCs were normochromic, normocytic with unremarkable morphology.  There were no schistocytes.  There were rare giant platelets and no blasts.  Blood cultures were obtained and were negative.  He received broad spectrum antibiotics (vancomycin and Zosyn).  He was noted to have right lower extremity edema.  Right lower extremity duplex on 05/27/2019 revealed no evidence of DVT.  CBCs have been followed: 06/21/2015: Hematocrit 42.6, hemoglobin 14.7, MCV 88.7, platelets 161,000, WBC 8600. 05/25/2019: Hematocrit 45.6, hemoglobin 15.3, MCV 90.3, platelets 95,000, WBC 1300. 05/26/2019: Hematocrit 40.1, hemoglobin 13.1, MCV 90.3, platelets 74,000, WBC 4600.  05/27/2019: Hematocrit 39.3, hemoglobin 13.2, MCV 89.5, platelets 70,000, WBC 3400.   Work-up revealed the following negative studies:  HIV, COVID-19 negative, PT, PTT, fibrinogen, folate, ANA, hepatitis B and C testing, fibrinogen, copper.  B12 was 223 (low).  He was started on oral B12.  During the interim, he has been slowly getting better. He has started taking B12 vitamins daily.    Symptomatically, he feels weak but can get around and walk without being in a wheelchair. He takes care of all of his activities of daily living.  He hasn't gotten a PCP yet ; his daughter is trying to get him a doctor at th [facility].  He is not currently working. His daughter mainly cooks his meals. He says his appetite has been good since being discharged from the hospital.  He no longer feel " vice pain" around  his legs, like he had when he was at the hospital.  He describes a history of 2 vertebral disks "exploding inward" and residual leg atrophy from having to wait for his second back surgery.   Past Medical History:  Diagnosis Date  . 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 Cervical spinal stenosis 05/09/2014  . Kidney stone   . Neuropathy     Past Surgical History:  Procedure Laterality Date  . [place]   . CYSTOSCOPY/URETEROSCOPY/HOLMIUM LASER/STENT PLACEMENT  2002  . EXTRACORPOREAL SHOCK WAVE LITHOTRIPSY Right 06/29/2015   Procedure: EXTRACORPOREAL SHOCK WAVE LITHOTRIPSY (ESWL);  Surgeon: Nickie Retort, MD;  Location: [facility] ORS;  Service: Urology;  Laterality: Right;    Family History  Problem Relation Age of Onset  . Prostate cancer Neg Hx   . Bladder Cancer Neg Hx   . Kidney cancer Neg Hx     Social History:  reports that he has been smoking cigarettes. He has been smoking about 0.50 packs per day. He has never used smokeless tobacco. He reports current alcohol use. He reports that he does not use drugs.  He has smoked 1/2 pack per week since the age of 58.  He drinks alcohol "now and then".  He is a Physiological scientist.  He denies any exposure to toxins.  The patient is accompanied by his sister-in-law, Riley Lam today.  Allergies: No Known Allergies  Current Medications: Current Outpatient Medications  Medication Sig Dispense Refill  . gabapentin (NEURONTIN) 300 MG capsule Take 600 mg by mouth at bedtime.  11  . nicotine (NICODERM CQ - DOSED IN MG/24 HOURS) 21 mg/24hr patch Place 1 patch (21 mg total) onto the skin daily. 28 patch 0  . vitamin B-12 1000 MCG tablet Take 1 tablet (1,000 mcg total) by mouth daily. 30 tablet 1   No current facility-administered medications for this visit.    Review of Systems  Constitutional: Negative.  Negative for chills, diaphoresis, fever, malaise/fatigue and weight loss.       Slowly getting better.  HENT: Negative for congestion, ear pain, hearing loss, nosebleeds, sinus pain and sore throat.   Eyes: Negative.  Negative for blurred vision and double vision.  Respiratory: Negative.  Negative for cough, hemoptysis, sputum production, shortness of breath and wheezing.   Cardiovascular: Negative.  Negative for chest pain and palpitations.  Gastrointestinal: Negative.  Negative for abdominal pain, blood in stool,  constipation, diarrhea, heartburn, nausea and vomiting.  Genitourinary: Negative.  Negative for dysuria, frequency, hematuria and urgency.  Musculoskeletal: Negative.  Negative for back pain, falls, joint pain, myalgias and neck pain.  Skin: Negative.  Negative for rash.  Neurological: Positive for weakness (generalized). Negative for dizziness, sensory change, speech change, focal weakness and headaches.  Endo/Heme/Allergies: Negative.  Does not bruise/bleed easily.  Psychiatric/Behavioral: Negative.  Negative for depression and memory loss. The patient is not nervous/anxious and does not have insomnia.    Performance status (ECOG):  1  Vitals Blood pressure 139/88, pulse 76, temperature (!) 96.7 F (35.9 C), temperature source Tympanic, resp. rate 18, weight 189 lb 0.7 oz (85.7 kg), SpO2 99 %.   Physical Exam  Constitutional: He is oriented to person, place, and time. He appears well-developed and well-nourished. No distress.  HENT:  Head: Normocephalic and atraumatic.  Mouth/Throat: Oropharynx is clear and moist. No oropharyngeal exudate.  Black cap.  Gray hair.  Mask.  Eyes: Pupils are equal, round, and reactive to light. Conjunctivae  are normal. No scleral icterus.  Glasses.  Neck: No JVD present.  Cardiovascular: Normal rate, regular rhythm and normal heart sounds.  Pulmonary/Chest: Effort normal and breath sounds normal. No respiratory distress. He has no wheezes. He has no rales.  Abdominal: Soft. Bowel sounds are normal. He exhibits no distension and no mass. There is no hepatosplenomegaly. There is no abdominal tenderness. There is no rebound and no guarding.  Musculoskeletal:        General: No tenderness or edema.     Cervical back: Normal range of motion and neck supple.     Comments: Right leg > left leg secondary to mild left leg atrophy (old).  Lymphadenopathy:       Head (right side): No preauricular, no posterior auricular and no occipital adenopathy present.        Head (left side): No preauricular, no posterior auricular and no occipital adenopathy present.    He has no cervical adenopathy.    He has no axillary adenopathy.       Right: No inguinal and no supraclavicular adenopathy present.       Left: No inguinal and no supraclavicular adenopathy present.  Neurological: He is alert and oriented to person, place, and time. No cranial nerve deficit or sensory deficit. He exhibits normal muscle tone. He displays a negative Romberg sign.  Reflex Scores:      Patellar reflexes are 2+ on the right side and 2+ on the left side. Skin: Skin is warm and dry. No rash noted. He is not diaphoretic. No erythema. No pallor.  Psychiatric: He has a normal mood and affect. His behavior is normal. Judgment and thought content normal.  Nursing note and vitals reviewed.   No visits with results within 3 Day(s) from this visit.  Latest known visit with results is:  Admission on 05/25/2019, Discharged on 05/27/2019  Component Date Value Ref Range Status  . WBC 05/25/2019 1.3* 4.0 - 10.5 K/uL Final   This critical result has verified and been called to Nadyne Coombes by Vladimir Crofts on 01 19 2021 at 1121, and has been read back.   Marland Kitchen RBC 05/25/2019 5.06  4.22 - 5.81 MIL/uL Final  . Hemoglobin 05/25/2019 15.2  13.0 - 17.0 g/dL Final  . HCT 05/25/2019 45.7  39.0 - 52.0 % Final  . MCV 05/25/2019 90.3  80.0 - 100.0 fL Final  . MCH 05/25/2019 30.0  26.0 - 34.0 pg Final  . MCHC 05/25/2019 33.3  30.0 - 36.0 g/dL Final  . RDW 05/25/2019 13.0  11.5 - 15.5 % Final  . Platelets 05/25/2019 94* 150 - 400 K/uL Final   Comment: PLATELET COUNT CONFIRMED BY SMEAR Immature Platelet Fraction may be clinically indicated, consider ordering this additional test [id number]   . nRBC 05/25/2019 0.0  0.0 - 0.2 % Final   Performed at [facility], [address] [place], [place] [address]  . Color, Urine 05/25/2019 YELLOW* YELLOW Final  . APPearance 05/25/2019 CLEAR* CLEAR Final  .  Specific Gravity, Urine 05/25/2019 1.014  1.005 - 1.030 Final  . pH 05/25/2019 5.0  5.0 - 8.0 Final  . Glucose, UA 05/25/2019 NEGATIVE  NEGATIVE mg/dL Final  . Hgb urine dipstick 05/25/2019 MODERATE* NEGATIVE Final  . Bilirubin Urine 05/25/2019 NEGATIVE  NEGATIVE Final  . Ketones, ur 05/25/2019 NEGATIVE  NEGATIVE mg/dL Final  . Protein, ur 05/25/2019 NEGATIVE  NEGATIVE mg/dL Final  . Nitrite 05/25/2019 NEGATIVE  NEGATIVE Final  . Chalmers Guest 05/25/2019 NEGATIVE  NEGATIVE Final  .  RBC / HPF 05/25/2019 0-5  0 - 5 RBC/hpf Final  . WBC, UA 05/25/2019 0-5  0 - 5 WBC/hpf Final  . Bacteria, UA 05/25/2019 NONE SEEN  NONE SEEN Final  . Squamous Epithelial / LPF 05/25/2019 NONE SEEN  0 - 5 Final  . Mucus 05/25/2019 PRESENT   Final   Performed at [facility], [address], [place], [place] [address]  . Sodium 05/25/2019 144  135 - 145 mmol/L Final  . Potassium 05/25/2019 4.4  3.5 - 5.1 mmol/L Final   HEMOLYSIS AT THIS LEVEL MAY AFFECT RESULT  . Chloride 05/25/2019 109  98 - 111 mmol/L Final  . CO2 05/25/2019 26  22 - 32 mmol/L Final  . Glucose, Bld 05/25/2019 103* 70 - 99 mg/dL Final  . BUN 05/25/2019 15  8 - 23 mg/dL Final  . Creatinine, Ser 05/25/2019 1.21  0.61 - 1.24 mg/dL Final  . Calcium 05/25/2019 8.6* 8.9 - 10.3 mg/dL Final  . Total Protein 05/25/2019 6.8  6.5 - 8.1 g/dL Final  . Albumin 05/25/2019 3.7  3.5 - 5.0 g/dL Final  . AST 05/25/2019 26  15 - 41 U/L Final   HEMOLYSIS AT THIS LEVEL MAY AFFECT RESULT  . ALT 05/25/2019 19  0 - 44 U/L Final  . Alkaline Phosphatase 05/25/2019 88  38 - 126 U/L Final  . Total Bilirubin 05/25/2019 1.2  0.3 - 1.2 mg/dL Final   HEMOLYSIS AT THIS LEVEL MAY AFFECT RESULT  . GFR calc non Af Amer 05/25/2019 >60  >60 mL/min Final  . GFR calc Af Amer 05/25/2019 >60  >60 mL/min Final  . Anion gap 05/25/2019 9  5 - 15 Final   Performed at [facility], [address] [place], [place] [address]  . SARS Coronavirus 2 by RT PCR  05/25/2019 NEGATIVE  NEGATIVE Final   Comment: (NOTE) SARS-CoV-2 target nucleic acids are NOT DETECTED. The SARS-CoV-2 RNA is generally detectable in upper respiratoy specimens during the acute phase of infection. The lowest concentration of SARS-CoV-2 viral copies this assay can detect is 131 copies/mL. A negative result does not preclude SARS-Cov-2 infection and should not be used as the sole basis for treatment or other patient management decisions. A negative result may occur with  improper specimen collection/handling, submission of specimen other than nasopharyngeal swab, presence of viral mutation(s) within the areas targeted by this assay, and inadequate number of viral copies (<131 copies/mL). A negative result must be combined with clinical observations, patient history, and epidemiological information. The expected result is Negative. Fact Sheet for Patients:  PinkCheek.be Fact Sheet for Healthcare Providers:  GravelBags.it This test is not yet ap                          proved or cleared by the [place] FDA and  has been authorized for detection and/or diagnosis of SARS-CoV-2 by FDA under an Emergency Use Authorization (EUA). This EUA will remain  in effect (meaning this test can be used) for the duration of the COVID-19 declaration under Section 564(b)(1) of the Act, 21 U.S.C. section 360bbb-3(b)(1), unless the authorization is terminated or revoked sooner.   . Influenza A by PCR 05/25/2019 NEGATIVE  NEGATIVE Final  . Influenza B by PCR 05/25/2019 NEGATIVE  NEGATIVE Final   Comment: (NOTE) The Xpert Xpress SARS-CoV-2/FLU/RSV assay is intended as an aid in  the diagnosis of influenza from Nasopharyngeal swab specimens and  should not be used  as a sole basis for treatment. Nasal washings and  aspirates are unacceptable for Xpert Xpress SARS-CoV-2/FLU/RSV  testing. Fact Sheet for  Patients: PinkCheek.be Fact Sheet for Healthcare Providers: GravelBags.it This test is not yet approved or cleared by the [place] FDA and  has been authorized for detection and/or diagnosis of SARS-CoV-2 by  FDA under an Emergency Use Authorization (EUA). This EUA will remain  in effect (meaning this test can be used) for the duration of the  Covid-19 declaration under Section 564(b)(1) of the Act, 21  U.S.C. section 360bbb-3(b)(1), unless the authorization is  terminated or revoked. Performed at [facility], [address], [place], [place] [address]   . Lactic Acid, Venous 05/25/2019 2.3* 0.5 - 1.9 mmol/L Final   Comment: CRITICAL RESULT CALLED TO, READ BACK BY AND VERIFIED WITH KATE BUMGARNER AT 1240ON 05/25/2019 [place]. Performed at [facility], [address], [place], [place] [address]   . Specimen Description 05/25/2019 BLOOD RIGHT AC   Final  . Special Requests 05/25/2019 BOTTLES DRAWN AEROBIC AND ANAEROBIC Blood Culture adequate volume   Final  . Culture 05/25/2019    Final                   Value:NO GROWTH 5 DAYS Performed at [facility], [address] [place], [place] [address]   . Report Status 05/25/2019 05/30/2019 FINAL   Final  . Specimen Description 05/25/2019 BLOOD BLOOD RIGHT HAND   Final  . Special Requests 05/25/2019 BOTTLES DRAWN AEROBIC AND ANAEROBIC Blood Culture adequate volume   Final  . Culture 05/25/2019    Final                   Value:NO GROWTH 5 DAYS Performed at [facility], [address], [place], [place] [address]   . Report Status 05/25/2019 05/30/2019 FINAL   Final  . CRP 05/25/2019 1.8* <1.0 mg/dL Final   Performed at [facility], [place] [address], [place], [place] [address]  . WBC 05/25/2019 1.3* 4.0 - 10.5 K/uL Final   Comment: REPEATED TO VERIFY THIS CRITICAL RESULT HAS VERIFIED AND BEEN CALLED TO KATE BUMGARNER BY HINA PATEL ON 01  19 2021 AT 1243, AND HAS BEEN READ BACK.  THIS CRITICAL RESULT HAS VERIFIED AND BEEN CALLED TO KATE BUMGARNER BY HINA PATEL ON 01 19 2021 AT 1244, AND HAS BEEN READ BACK.    Marland Kitchen RBC 05/25/2019 5.05  4.22 - 5.81 MIL/uL Final  . Hemoglobin 05/25/2019 15.3  13.0 - 17.0 g/dL Final  . HCT 05/25/2019 45.6  39.0 - 52.0 % Final  . MCV 05/25/2019 90.3  80.0 - 100.0 fL Final  . MCH 05/25/2019 30.3  26.0 - 34.0 pg Final  . MCHC 05/25/2019 33.6  30.0 - 36.0 g/dL Final  . RDW 05/25/2019 13.0  11.5 - 15.5 % Final  . Platelets 05/25/2019 95* 150 - 400 K/uL Final   Comment: PLATELET COUNT CONFIRMED BY SMEAR Immature Platelet Fraction may be clinically indicated, consider ordering this additional test [id number]   . nRBC 05/25/2019 0.0  0.0 - 0.2 % Final  . Neutrophils Relative % 05/25/2019 78  % Final  . Neutro Abs 05/25/2019 1.0* 1.7 - 7.7 K/uL Final  . Lymphocytes Relative 05/25/2019 18  % Final  . Lymphs Abs 05/25/2019 0.2* 0.7 - 4.0 K/uL Final  . Monocytes Relative 05/25/2019 1  % Final  . Monocytes Absolute 05/25/2019 0.0* 0.1 - 1.0 K/uL Final  .  Eosinophils Relative 05/25/2019 1  % Final  . Eosinophils Absolute 05/25/2019 0.0  0.0 - 0.5 K/uL Final  . Basophils Relative 05/25/2019 1  % Final  . Basophils Absolute 05/25/2019 0.0  0.0 - 0.1 K/uL Final  . Immature Granulocytes 05/25/2019 1  % Final  . Abs Immature Granulocytes 05/25/2019 0.01  0.00 - 0.07 K/uL Final   Performed at [facility], [address] [place], [place] [address]  . LDH 05/25/2019 313* 98 - 192 U/L Final   Performed at [facility], [address] [place], [place] [address]  . Smear Review 05/25/2019 SMEAR STAINED AND AVAILABLE FOR REVIEW   Final   Performed at [facility], [address] [place], [place] [address]  . HIV Screen 4th Generation wRfx 05/25/2019 NON REACTIVE  NON REACTIVE Final   Performed at [facility], [place] [address], [place], [place] [address]  . Prothrombin Time  05/25/2019 17.1* 11.4 - 15.2 seconds Final  . INR 05/25/2019 1.4* 0.8 - 1.2 Final   Comment: (NOTE) INR goal varies based on device and disease states. Performed at [facility], [address] [place], [place] [address]   . aPTT 05/25/2019 46* 24 - 36 seconds Final   Comment:        IF BASELINE aPTT IS ELEVATED, SUGGEST PATIENT RISK ASSESSMENT BE USED TO DETERMINE APPROPRIATE ANTICOAGULANT THERAPY. Performed at [facility], [address], [place], [place] [address]   . Path Review 05/25/2019 Blood smear is reviewed.   Final   Comment: Absolute leukopenia with lymphopenia. ANC of 1.0k/uL. Normochomic, normocytic erythrocytes, with unremarkable morphology. Thrombocytopenia with rare giant platelets. The cause of the patient's cytopenias is unclear from morphologic review alone. Potential secondary causes of cytopenias should be considered, including nutritional deficiencies, medication/toxin exposure, viral illness, and/or autoimmune/rheumatologic disease. Reviewed by Kathi Simpers, M.D. Performed at [facility], [address] [place], [place] [address]   . Sodium 05/26/2019 143  135 - 145 mmol/L Final  . Potassium 05/26/2019 3.8  3.5 - 5.1 mmol/L Final  . Chloride 05/26/2019 112* 98 - 111 mmol/L Final  . CO2 05/26/2019 25  22 - 32 mmol/L Final  . Glucose, Bld 05/26/2019 96  70 - 99 mg/dL Final  . BUN 05/26/2019 21  8 - 23 mg/dL Final  . Creatinine, Ser 05/26/2019 1.17  0.61 - 1.24 mg/dL Final  . Calcium 05/26/2019 7.6* 8.9 - 10.3 mg/dL Final  . GFR calc non Af Amer 05/26/2019 >60  >60 mL/min Final  . GFR calc Af Amer 05/26/2019 >60  >60 mL/min Final  . Anion gap 05/26/2019 6  5 - 15 Final   Performed at [facility], [address], [place], [place] [address]  . Anti Nuclear Antibody (ANA) 05/26/2019 Negative  Negative Final   Comment: (NOTE) Performed At: [facility] [place], [place] [id number] Rush Farmer MD [id number]   . Vitamin B-12 05/26/2019 223  180 - 914 pg/mL Final   Comment: (NOTE) This assay is not validated for testing neonatal or myeloproliferative syndrome specimens for Vitamin B12 levels. Performed at [facility], [place] [address] [place], [place] [address]   . Folate 05/26/2019 11.5  >5.9 ng/mL Final   Performed at [facility], [address] [place],  [address]  . Copper 05/26/2019 80  69 - 132 ug/dL Final   Comment: (NOTE) This test was developed and its performance characteristics determined by [facility].  It has not been cleared or approved by the Food and Drug Administration.                                    Detection Limit = 5              **Please note reference interval change** Performed At: [facility] [place], [place] [id number] Rush Farmer MD [id number]   . Hep B Core Total Ab 05/26/2019 NON REACTIVE  NON REACTIVE Final   Performed at [facility], [place] [address], [place], [place] [address]  . HCV Ab 05/26/2019 NON REACTIVE  NON REACTIVE Final   Comment: (NOTE) Nonreactive HCV antibody screen is consistent with no HCV infections,  unless recent infection is suspected or other evidence exists to indicate HCV infection. Performed at [facility], [place] [address] [place], [place] [address]   . Fibrinogen 05/26/2019 244  210 - 475 mg/dL Final   Performed at [facility], [address] [place], [place] [address]  . Total CK 05/26/2019 99  49 - 397 U/L Final   Performed at [facility], [address] [place], [place] [address]  . WBC 05/26/2019 4.6  4.0 - 10.5 K/uL Final   REPEATED TO VERIFY  . RBC 05/26/2019 4.44  4.22 - 5.81 MIL/uL Final  . Hemoglobin 05/26/2019 13.1  13.0 - 17.0 g/dL Final   REPEATED TO VERIFY  . HCT 05/26/2019 40.1  39.0 - 52.0 % Final  . MCV 05/26/2019 90.3  80.0 - 100.0 fL Final  . MCH 05/26/2019 29.5  26.0 - 34.0 pg Final  . MCHC  05/26/2019 32.7  30.0 - 36.0 g/dL Final  . RDW 05/26/2019 13.4  11.5 - 15.5 % Final  . Platelets 05/26/2019 74* 150 - 400 K/uL Final   Comment: Immature Platelet Fraction may be clinically indicated, consider ordering this additional test [id number] CONSISTENT WITH PREVIOUS RESULT   . nRBC 05/26/2019 0.0  0.0 - 0.2 % Final  . Neutrophils Relative % 05/26/2019 75  % Final  . Neutro Abs 05/26/2019 3.4  1.7 - 7.7 K/uL Final  . Lymphocytes Relative 05/26/2019 12  % Final  . Lymphs Abs 05/26/2019 0.6* 0.7 - 4.0 K/uL Final  . Monocytes Relative 05/26/2019 8  % Final  . Monocytes Absolute 05/26/2019 0.4  0.1 - 1.0 K/uL Final  . Eosinophils Relative 05/26/2019 4  % Final  . Eosinophils Absolute 05/26/2019 0.2  0.0 - 0.5 K/uL Final  . Basophils Relative 05/26/2019 1  % Final  . Basophils Absolute 05/26/2019 0.0  0.0 - 0.1 K/uL Final  . Immature Granulocytes 05/26/2019 0  % Final  . Abs Immature Granulocytes 05/26/2019 0.01  0.00 - 0.07 K/uL Final   Performed at [facility], [address] [place],  [address]  . WBC 05/27/2019 3.4* 4.0 - 10.5 K/uL Final  . RBC 05/27/2019 4.39  4.22 - 5.81 MIL/uL Final  . Hemoglobin 05/27/2019 13.2  13.0 - 17.0 g/dL Final  . HCT 05/27/2019 39.3  39.0 - 52.0 % Final  . MCV 05/27/2019 89.5  80.0 - 100.0 fL Final  . MCH 05/27/2019 30.1  26.0 - 34.0 pg Final  . MCHC 05/27/2019 33.6  30.0 - 36.0 g/dL Final  . RDW 05/27/2019 12.9  11.5 - 15.5 % Final  . Platelets 05/27/2019 70* 150 - 400 K/uL Final  Comment: Immature Platelet Fraction may be clinically indicated, consider ordering this additional test [id number] CONSISTENT WITH PREVIOUS RESULT   . nRBC 05/27/2019 0.0  0.0 - 0.2 % Final   Performed at [facility], [address] [place], [place] [address]  . Sodium 05/27/2019 142  135 - 145 mmol/L Final  . Potassium 05/27/2019 4.1  3.5 - 5.1 mmol/L Final  . Chloride 05/27/2019 111  98 - 111 mmol/L Final  . CO2 05/27/2019 24  22 -  32 mmol/L Final  . Glucose, Bld 05/27/2019 97  70 - 99 mg/dL Final  . BUN 05/27/2019 14  8 - 23 mg/dL Final  . Creatinine, Ser 05/27/2019 0.86  0.61 - 1.24 mg/dL Final  . Calcium 05/27/2019 8.0* 8.9 - 10.3 mg/dL Final  . Total Protein 05/27/2019 5.8* 6.5 - 8.1 g/dL Final  . Albumin 05/27/2019 3.0* 3.5 - 5.0 g/dL Final  . AST 05/27/2019 17  15 - 41 U/L Final  . ALT 05/27/2019 19  0 - 44 U/L Final  . Alkaline Phosphatase 05/27/2019 58  38 - 126 U/L Final  . Total Bilirubin 05/27/2019 0.9  0.3 - 1.2 mg/dL Final  . GFR calc non Af Amer 05/27/2019 >60  >60 mL/min Final  . GFR calc Af Amer 05/27/2019 >60  >60 mL/min Final  . Anion gap 05/27/2019 7  5 - 15 Final   Performed at [facility], [address], [place], [place] [address]  . Magnesium 05/27/2019 2.0  1.7 - 2.4 mg/dL Final   Performed at [facility], [address] [place], [place] [id number]  . aPTT 05/27/2019 30.2  22.9 - 30.2 sec Final   Comment: (NOTE) The aPTT is normal so plasma mixing tests are not indicated. Performed At: [facility] [place], [place] [id number] Rush Farmer MD [id number]   . aPTT 1:1 Normal Plasma 05/27/2019 NOT PERFORMED   Final   Test not performed  . aPTT 1:1 Mix Saline 05/27/2019 NOT PERFORMED   Final   Test not performed  . aPTT 1:1 NP Mix, 60 Min,Incub. 05/27/2019 NOT PERFORMED   Final   Test not performed    Assessment:  Martin [name] is a 64 y.o. male with leukopenia and thrombocytopenia of unclear duration.  He presented with rigors and generalized weakness.  He denies any new medications or herbal products.  He denies alcohol use.  Work-up on 05/26/2019 revealed the following normal labs: HIV, COVID-19 negative, PT, PTT, fibrinogen, folate, ANA, hepatitis B and C testing, fibrinogen, copper.  B12 was 223 (low).  Peripheral smear revealed absolute leukopenia with lymphopenia.  RBCs were normochromic, normocytic with unremarkable morphology.   There were no schistocytes.  There were rare giant platelets and no blasts.   He has B12 deficiency.  B12 was 223 on 05/25/2019.  He began oral B12 on 05/27/2019.  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  Right lower extremity duplex on 05/27/2019 revealed no evidence of DVT.  Symptomatically, he is feeling better.  He denies any bleeding.  He has had no fever.  Plan: 1.   Labs today:  CBC with diff, intrinsic factor antibodies, antiparietal cell antibodies. 2.   Leukopenia, resolved  WBC 1300 with an ANC 1000 on 05/25/2019.             WBC 4600 with an [place] 3400 on 05/26/2019.  WBC 6300 with an [place] 4400 on 06/03/2019. 3.   Thrombocytopenia, resolved  Platelet count is 197,000.  Etiology was likely secondary to transient marrow suppression.             CBC on 06/21/2015 was normal.             Peripheral smear revealed no blasts or platelet clumping.             HIV and COVID-19 were negative.             PT 17.1 (INR 1.4) and PTT 46 (24-36).    Repeat PTT was 30.2 on 05/27/2019.  B12 was 223 (low).             Normal studies: folate, ANA, hepatitis B and C testing, fibrinogen, copper. 4.   B12 deficiency  B12 was 223 (low) on 05/26/2019.  He began oral B12 on 05/27/2019.  Discuss testing to r/o pernicious anemia. 5.   RTC in 06/24/2019 for labs (CBC with diff, B12 level). 6.   RN to call patient with lab results. 7.   RTC in 3 months for MD assessment and labs (CBC with diff +/- others).  I discussed the assessment and treatment plan with the patient.  The patient was provided an opportunity to ask questions and all were answered.  The patient agreed with the plan and demonstrated an understanding of the instructions.  The patient was advised to call back if the symptoms worsen or if the condition fails to improve as anticipated.  I provided 16 minutes (3:34 PM - 3:50 PM) of face-to-face time during this this encounter and > 50% was spent counseling as  documented under my assessment and plan.    Lequita Asal, MD, PhD    06/03/2019, 3:50 PM  I, Samul Dada, am acting as a scribe for Lequita Asal, MD.  I, [place] Mike Gip, MD, have reviewed the above documentation for accuracy and completeness, and I agree with the above.

## 2019-06-03 ENCOUNTER — Inpatient Hospital Stay: Payer: BC Managed Care – PPO | Attending: Hematology and Oncology | Admitting: Hematology and Oncology

## 2019-06-03 ENCOUNTER — Other Ambulatory Visit: Payer: Self-pay

## 2019-06-03 ENCOUNTER — Telehealth: Payer: Self-pay

## 2019-06-03 ENCOUNTER — Encounter: Payer: Self-pay | Admitting: Hematology and Oncology

## 2019-06-03 ENCOUNTER — Inpatient Hospital Stay: Payer: BC Managed Care – PPO

## 2019-06-03 VITALS — BP 139/88 | HR 76 | Temp 96.7°F | Resp 18 | Wt 189.0 lb

## 2019-06-03 DIAGNOSIS — E538 Deficiency of other specified B group vitamins: Secondary | ICD-10-CM | POA: Diagnosis not present

## 2019-06-03 DIAGNOSIS — D696 Thrombocytopenia, unspecified: Secondary | ICD-10-CM | POA: Diagnosis not present

## 2019-06-03 DIAGNOSIS — D72819 Decreased white blood cell count, unspecified: Secondary | ICD-10-CM | POA: Diagnosis present

## 2019-06-03 DIAGNOSIS — F17210 Nicotine dependence, cigarettes, uncomplicated: Secondary | ICD-10-CM | POA: Insufficient documentation

## 2019-06-03 LAB — CBC WITH DIFFERENTIAL/PLATELET
Abs Immature Granulocytes: 0.03 10*3/uL (ref 0.00–0.07)
Basophils Absolute: 0 10*3/uL (ref 0.0–0.1)
Basophils Relative: 1 %
Eosinophils Absolute: 0.2 10*3/uL (ref 0.0–0.5)
Eosinophils Relative: 3 %
HCT: 48.3 % (ref 39.0–52.0)
Hemoglobin: 16.6 g/dL (ref 13.0–17.0)
Immature Granulocytes: 1 %
Lymphocytes Relative: 19 %
Lymphs Abs: 1.2 10*3/uL (ref 0.7–4.0)
MCH: 30 pg (ref 26.0–34.0)
MCHC: 34.4 g/dL (ref 30.0–36.0)
MCV: 87.3 fL (ref 80.0–100.0)
Monocytes Absolute: 0.5 10*3/uL (ref 0.1–1.0)
Monocytes Relative: 7 %
Neutro Abs: 4.4 10*3/uL (ref 1.7–7.7)
Neutrophils Relative %: 69 %
Platelets: 197 10*3/uL (ref 150–400)
RBC: 5.53 MIL/uL (ref 4.22–5.81)
RDW: 13 % (ref 11.5–15.5)
WBC: 6.3 10*3/uL (ref 4.0–10.5)
nRBC: 0 % (ref 0.0–0.2)

## 2019-06-03 NOTE — Progress Notes (Signed)
Pt in for follow up after hospitalization. Reports legs "ache". States wife has stage 4 lung cancer and unable to be at visit so sister in law is with patient today and Pt would like her to be part of the MD visit today.

## 2019-06-03 NOTE — Telephone Encounter (Signed)
Per Dr Mike Gip give the patient a copy of his labs and  inform he is does not have any restriction to go back to work.

## 2019-06-04 LAB — INTRINSIC FACTOR ANTIBODIES: Intrinsic Factor: 1 AU/mL (ref 0.0–1.1)

## 2019-06-04 LAB — ANTI-PARIETAL ANTIBODY: Parietal Cell Antibody-IgG: 1.2 Units (ref 0.0–20.0)

## 2019-06-07 ENCOUNTER — Ambulatory Visit
Admission: EM | Admit: 2019-06-07 | Discharge: 2019-06-07 | Disposition: A | Discharge status: Home / Self Care | Payer: BC Managed Care – PPO | Attending: Family Medicine | Admitting: Family Medicine

## 2019-06-07 ENCOUNTER — Ambulatory Visit (INDEPENDENT_AMBULATORY_CARE_PROVIDER_SITE_OTHER): Payer: BC Managed Care – PPO

## 2019-06-07 ENCOUNTER — Other Ambulatory Visit: Payer: Self-pay

## 2019-06-07 DIAGNOSIS — R6883 Chills (without fever): Secondary | ICD-10-CM

## 2019-06-07 DIAGNOSIS — F17210 Nicotine dependence, cigarettes, uncomplicated: Secondary | ICD-10-CM | POA: Diagnosis not present

## 2019-06-07 LAB — CBC WITH DIFFERENTIAL/PLATELET
Abs Immature Granulocytes: 0.01 10*3/uL (ref 0.00–0.07)
Basophils Absolute: 0 10*3/uL (ref 0.0–0.1)
Basophils Relative: 1 %
Eosinophils Absolute: 0.2 10*3/uL (ref 0.0–0.5)
Eosinophils Relative: 3 %
HCT: 43.7 % (ref 39.0–52.0)
Hemoglobin: 15 g/dL (ref 13.0–17.0)
Immature Granulocytes: 0 %
Lymphocytes Relative: 20 %
Lymphs Abs: 1.1 10*3/uL (ref 0.7–4.0)
MCH: 29.9 pg (ref 26.0–34.0)
MCHC: 34.3 g/dL (ref 30.0–36.0)
MCV: 87.2 fL (ref 80.0–100.0)
Monocytes Absolute: 0.4 10*3/uL (ref 0.1–1.0)
Monocytes Relative: 8 %
Neutro Abs: 3.7 10*3/uL (ref 1.7–7.7)
Neutrophils Relative %: 68 %
Platelets: 168 10*3/uL (ref 150–400)
RBC: 5.01 MIL/uL (ref 4.22–5.81)
RDW: 12.8 % (ref 11.5–15.5)
WBC: 5.3 10*3/uL (ref 4.0–10.5)
nRBC: 0 % (ref 0.0–0.2)

## 2019-06-07 NOTE — Discharge Instructions (Signed)
Labs and xray normal.  Follow up with Dr. Mike Gip.  Take care  Dr. Lacinda Axon

## 2019-06-07 NOTE — ED Provider Notes (Signed)
[facility]    CSN: [id number] Arrival date & time: 06/07/19  1138      History   Chief Complaint Chief Complaint  Patient presents with  . Chills   HPI  64 year old male presents with chills.  Patient recently hospitalized from 1/19-1/21.  Hospital records reviewed.  In summary: Presented with rigors and generalized weakness.  Found to have a WBC count of 1.3.  Was also thrombocytopenic, lowest value noted was 70.  Was placed empirically on antibiotic therapy.  Patient had thorough work-up which was negative for infectious source.  Patient was discharged home in stable condition.  He has since followed up with hematology/oncology.  Most recent visit on 1/28.  CBC at that time was normal.  Patient presents today reporting similar symptoms.  He reports that he has had "shaking and chills.  No documented fever.  Denies cough or shortness of breath.  No difficulties urinating.  He states that his symptoms are intermittent.  He is currently feeling well.  Denies pain.  No known inciting factor.  No known exacerbating or relieving factors.  No other complaints.  Past Medical History:  Diagnosis Date  . 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 Cervical spinal stenosis 05/09/2014  . Kidney stone   . Neuropathy     Patient Active Problem List   Diagnosis Date Noted  . Leg weakness, bilateral 05/25/2019  . Lymphopenia 05/25/2019  . Thrombocytopenia ([place]) 05/25/2019  . Tobacco abuse 05/25/2019  . 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 Rigors   . Cervical spinal stenosis 05/09/2014  . 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 [place]   . CYSTOSCOPY/URETEROSCOPY/HOLMIUM LASER/STENT PLACEMENT  2002  . EXTRACORPOREAL SHOCK WAVE LITHOTRIPSY Right 06/29/2015   Procedure: EXTRACORPOREAL SHOCK WAVE LITHOTRIPSY (ESWL);  Surgeon: Nickie Retort, MD;  Location: [facility] ORS;  Service:  Urology;  Laterality: Right;       Home Medications    Prior to Admission medications   Medication Sig Start Date End Date Taking? Authorizing Provider  gabapentin (NEURONTIN) 300 MG capsule Take 600 mg by mouth at bedtime. 06/09/15   [provider]  nicotine (NICODERM CQ - DOSED IN MG/24 HOURS) 21 mg/24hr patch Place 1 patch (21 mg total) onto the skin daily. 05/28/19   Kathie Dike, MD  vitamin B-12 1000 MCG tablet Take 1 tablet (1,000 mcg total) by mouth daily. 05/28/19   Kathie Dike, MD    Family History Family History  Problem Relation Age of Onset  . Prostate cancer Neg Hx   . Bladder Cancer Neg Hx   . Kidney cancer Neg Hx     Social History Social History   Tobacco Use  . Smoking status: Current Every Day Smoker    Packs/day: 0.50    Types: Cigarettes  . Smokeless tobacco: Never Used  Substance Use Topics  . Alcohol use: Yes    Alcohol/week: 0.0 standard drinks    Comment: rarely  . Drug use: No     Allergies   Patient has no known allergies.   Review of Systems Review of Systems  Constitutional: Positive for chills.  Respiratory: Negative.   Genitourinary: Negative.    Physical Exam Triage Vital Signs ED Triage Vitals  Enc Vitals Group     BP 06/07/19 1159 (!) 148/86     Pulse Rate 06/07/19 1159 65     Resp 06/07/19  1159 16     Temp 06/07/19 1159 97.9 F (36.6 C)     Temp Source 06/07/19 1159 Oral     SpO2 06/07/19 1159 99 %     Weight 06/07/19 1200 196 lb 3.4 oz (89 kg)     Height 06/07/19 1200 5\' 9"  (1.753 m)     Head Circumference --      Peak Flow --      Pain Score 06/07/19 1200 0     Pain Loc --      Pain Edu? --      Excl. in [place]? --    Updated Vital Signs BP (!) 148/86 (BP Location: Right Arm)   Pulse 65   Temp 97.9 F (36.6 C) (Oral)   Resp 16   Ht 5\' 9"  (1.753 m)   Wt 89 kg   SpO2 99%   BMI 28.98 kg/m   Visual Acuity Right Eye Distance:   Left Eye Distance:   Bilateral Distance:    Right Eye Near:    Left Eye Near:    Bilateral Near:     Physical Exam Vitals and nursing note reviewed.  Constitutional:      General: He is not in acute distress.    Appearance: Normal appearance. He is not ill-appearing.  HENT:     Head: Normocephalic and atraumatic.  Eyes:     General:        Right eye: No discharge.        Left eye: No discharge.     Conjunctiva/sclera: Conjunctivae normal.  Cardiovascular:     Rate and Rhythm: Normal rate and regular rhythm.     Heart sounds: No murmur.  Pulmonary:     Effort: Pulmonary effort is normal.     Breath sounds: Normal breath sounds. No wheezing, rhonchi or rales.  Neurological:     Mental Status: He is alert.  Psychiatric:        Mood and Affect: Mood normal.        Behavior: Behavior normal.    UC Treatments / Results  Labs (all labs ordered are listed, but only abnormal results are displayed) Labs Reviewed  CBC WITH DIFFERENTIAL/PLATELET    EKG   Radiology DG Chest 2 View  Result Date: 06/07/2019 CLINICAL DATA:  64 year old male with chills and rigors EXAM: CHEST - 2 VIEW COMPARISON:  05/25/2019 FINDINGS: Cardiomediastinal silhouette unchanged in size and contour. No evidence of central vascular congestion. No interlobular septal thickening. No pneumothorax or pleural effusion. No confluent airspace disease. No displaced fracture. IMPRESSION: Negative for acute cardiopulmonary disease Electronically Signed   By: Corrie Mckusick D.O.   On: 06/07/2019 13:13    Procedures Procedures (including critical care time)  Medications Ordered in UC Medications - No data to display  Initial Impression / Assessment and Plan / UC Course  I have reviewed the triage vital signs and the nursing notes.  Pertinent labs & imaging results that were available during my care of the patient were reviewed by me and considered in my medical decision making (see chart for details).    64 year old male presents with chills and rigors.  Acute illness with  systemic symptoms.  Recent notes from hospitalization reviewed and summarized in HPI.  Recent laboratory studies reviewed from his hospitalization as well as from his oncologist on 1/28.  Given symptoms, chest x-ray was done today and was negative.  Repeat CBC was normal.  Advised supportive care and close follow-up with hematology/oncology.  Final  Clinical Impressions(s) / UC Diagnoses   Final diagnoses:  Chills     Discharge Instructions     Labs and xray normal.  Follow up with Dr. Mike Gip.  Take care  Dr. Lacinda Axon    ED Prescriptions    None     PDMP not reviewed this encounter.   Coral Spikes, [place] 06/07/19 1656

## 2019-06-07 NOTE — ED Triage Notes (Signed)
Pt states he was hospitalized for same (shaking and chills) and was found to have "low blood cells" per patient report. Was released Thursday before last. Had follow up appointment last Thursday (1/28) and had blood work done and was all WNL per patient report. Now started again on Sunday with same shaking and chills he had initially.

## 2019-06-24 ENCOUNTER — Inpatient Hospital Stay: Payer: BC Managed Care – PPO

## 2019-06-25 ENCOUNTER — Other Ambulatory Visit: Payer: Self-pay

## 2019-06-28 ENCOUNTER — Inpatient Hospital Stay: Payer: BC Managed Care – PPO | Attending: Hematology and Oncology

## 2019-06-28 ENCOUNTER — Other Ambulatory Visit: Payer: Self-pay

## 2019-06-28 DIAGNOSIS — D696 Thrombocytopenia, unspecified: Secondary | ICD-10-CM | POA: Insufficient documentation

## 2019-06-28 DIAGNOSIS — D72819 Decreased white blood cell count, unspecified: Secondary | ICD-10-CM | POA: Diagnosis not present

## 2019-06-28 LAB — CBC WITH DIFFERENTIAL/PLATELET
Abs Immature Granulocytes: 0.03 10*3/uL (ref 0.00–0.07)
Basophils Absolute: 0 10*3/uL (ref 0.0–0.1)
Basophils Relative: 1 %
Eosinophils Absolute: 0.2 10*3/uL (ref 0.0–0.5)
Eosinophils Relative: 3 %
HCT: 43.8 % (ref 39.0–52.0)
Hemoglobin: 14.7 g/dL (ref 13.0–17.0)
Immature Granulocytes: 1 %
Lymphocytes Relative: 20 %
Lymphs Abs: 1.2 10*3/uL (ref 0.7–4.0)
MCH: 29.8 pg (ref 26.0–34.0)
MCHC: 33.6 g/dL (ref 30.0–36.0)
MCV: 88.8 fL (ref 80.0–100.0)
Monocytes Absolute: 0.4 10*3/uL (ref 0.1–1.0)
Monocytes Relative: 6 %
Neutro Abs: 4.1 10*3/uL (ref 1.7–7.7)
Neutrophils Relative %: 69 %
Platelets: 213 10*3/uL (ref 150–400)
RBC: 4.93 MIL/uL (ref 4.22–5.81)
RDW: 12.9 % (ref 11.5–15.5)
WBC: 6 10*3/uL (ref 4.0–10.5)
nRBC: 0 % (ref 0.0–0.2)

## 2019-06-28 LAB — VITAMIN B12: Vitamin B-12: 830 pg/mL (ref 180–914)

## 2019-07-26 ENCOUNTER — Encounter: Payer: Self-pay | Admitting: *Deleted

## 2019-07-26 ENCOUNTER — Telehealth: Payer: Self-pay | Admitting: *Deleted

## 2019-07-26 DIAGNOSIS — Z87891 Personal history of nicotine dependence: Secondary | ICD-10-CM

## 2019-07-26 NOTE — Telephone Encounter (Signed)
Received referral for initial lung cancer screening scan. Contacted patient and obtained smoking history,(current, 33 pack year) as well as answering questions related to screening process. Patient denies signs of lung cancer such as weight loss or hemoptysis. Patient denies comorbidity that would prevent curative treatment if lung cancer were found. Patient is scheduled for shared decision making visit and CT scan on 08/04/19 at 1015am.

## 2019-07-26 NOTE — Telephone Encounter (Signed)
Received referral for low dose lung cancer screening CT scan. Message left at phone number listed in EMR for patient to call me back to facilitate scheduling scan.  

## 2019-08-04 ENCOUNTER — Inpatient Hospital Stay: Payer: BC Managed Care – PPO | Attending: Oncology | Admitting: Hospice and Palliative Medicine

## 2019-08-04 ENCOUNTER — Ambulatory Visit
Admission: RE | Admit: 2019-08-04 | Discharge: 2019-08-04 | Disposition: A | Discharge status: Home / Self Care | Payer: BC Managed Care – PPO | Source: Ambulatory Visit | Attending: Oncology | Admitting: Oncology

## 2019-08-04 ENCOUNTER — Other Ambulatory Visit: Payer: Self-pay

## 2019-08-04 DIAGNOSIS — Z87891 Personal history of nicotine dependence: Secondary | ICD-10-CM | POA: Diagnosis not present

## 2019-08-04 NOTE — Progress Notes (Signed)
In accordance with CMS guidelines, patient has met eligibility criteria including age, absence of signs or symptoms of lung cancer.  Social History   Tobacco Use  . Smoking status: Current Every Day Smoker    Packs/day: 0.75    Years: 44.00    Pack years: 33.00    Types: Cigarettes  . Smokeless tobacco: Never Used  Substance Use Topics  . Alcohol use: Yes    Alcohol/week: 0.0 standard drinks    Comment: rarely  . Drug use: No      A shared decision-making session was conducted prior to the performance of CT scan. This includes one or more decision aids, includes benefits and harms of screening, follow-up diagnostic testing, over-diagnosis, false positive rate, and total radiation exposure.   Counseling on the importance of adherence to annual lung cancer LDCT screening, impact of co-morbidities, and ability or willingness to undergo diagnosis and treatment is imperative for compliance of the program.   Counseling on the importance of continued smoking cessation for former smokers; the importance of smoking cessation for current smokers, and information about tobacco cessation interventions have been given to patient including [place] and 1800 quit [place] programs.   Written order for lung cancer screening with LDCT has been given to the patient and any and all questions have been answered to the best of my abilities.    Yearly follow up will be coordinated by Burgess Estelle, Thoracic Navigator.  Time Total: 15 minutes  Visit consisted of counseling and education dealing with complex health screening. Greater than 50%  of this time was spent counseling and coordinating care related to the above assessment and plan.  Signed by: Altha Harm, PhD, NP-C [phone] (Work Cell)

## 2019-08-06 ENCOUNTER — Encounter: Payer: Self-pay | Admitting: *Deleted

## 2019-08-11 ENCOUNTER — Other Ambulatory Visit: Payer: Self-pay | Admitting: Internal Medicine

## 2019-08-11 DIAGNOSIS — J431 Panlobular emphysema: Secondary | ICD-10-CM

## 2019-08-11 DIAGNOSIS — M79605 Pain in left leg: Secondary | ICD-10-CM

## 2019-08-11 DIAGNOSIS — M79604 Pain in right leg: Secondary | ICD-10-CM

## 2019-08-16 ENCOUNTER — Other Ambulatory Visit: Payer: Self-pay

## 2019-08-16 ENCOUNTER — Ambulatory Visit
Admission: RE | Admit: 2019-08-16 | Discharge: 2019-08-16 | Disposition: A | Discharge status: Home / Self Care | Payer: BC Managed Care – PPO | Source: Ambulatory Visit | Attending: Internal Medicine | Admitting: Internal Medicine

## 2019-08-16 DIAGNOSIS — M79605 Pain in left leg: Secondary | ICD-10-CM | POA: Diagnosis present

## 2019-08-16 DIAGNOSIS — M79604 Pain in right leg: Secondary | ICD-10-CM | POA: Diagnosis not present

## 2019-08-16 DIAGNOSIS — J431 Panlobular emphysema: Secondary | ICD-10-CM

## 2019-08-31 NOTE — Progress Notes (Signed)
[facility]  [address], [address] [place], [place] [address] Phone: [phone]  Fax: [phone]   Clinic Day:  09/02/2019  Referring physician: Dion Body, MD  Chief Complaint: Martin [name] is a 64 y.o. male with leukopenia and thrombocytopenia who is seen for a 3 month assessment.  HPI:  The patient was last seen in the hematology clinic on 06/03/2019. At that time, he was feeling better. He denied any bleeding. He had no fever.  Hematocrit was 48.3, hemoglobin 16.6, MCV 87.3, platelets 197,000, WBC 6300 with an ANC of 4400.  Intrinsic factor and anti-parietal antibody were normal.  Patient was on oral B12.   Labs on 06/28/2019 included hematocrit 43.8, hemoglobin 14.7, platelets 213,000, WBC 6,000. Vitamin B12 was 830.   Patient spoke with Altha Harm, NP on 08/04/2019. He discussed the importance of adherence to annual lung cancer LDCT screening. Smoking cessation was encouraged.   Lung cancer screening chest CT on 08/04/2019 revealed Lung-RADS 2, benign appearance or behavior. Recommendation to continue annual screening with low-dose chest CT without contrast in 12 months.  Patient saw Dr. Tressia Miners at [facility] [place] on 08/10/2019. His hypertension was well controlled after being on Norvasc x 1 month. Patient remained on gabapentin and oral B12.  He denied any neuropathy or swelling. He had increased pain in thighs and calves after exertion. He had dyspnea with exertion and felt fatigued. Follow up was planned for 3 months.   Bilateral lower extremity arterial duplex scan on 08/16/2019 revealed a normal lower extremity arterial duplex examination. Bilateral lower extremity arteries were patent with normal velocities and waveforms. There was no significant stenosis.  Echo on 08/30/2019 at [facility] showed an EF of >55%.   During the interim, the patient has felt "not too bad". He is taking 1,000 mcg oral B12 per day. Dr. Tressia Miners is his new PCP.  He notes some left ankle swelling without trauma. He denies any B symptoms. He has no headaches or visual changes. No infections. Patient states that he is feeling tired and gets tired easy. He feels weak. He states that on 08/31/2019 he started having chills and shakes but felt better when he woke up the next morning. Patient is unsure if he wants to get the COVID-19 vaccine. His wife and daughter had both their COVID-19 vaccines without complications. Patient agreed to follow up as needed.    Past Medical History:  Diagnosis Date  . 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 Cervical spinal stenosis 05/09/2014  . Kidney stone   . Neuropathy     Past Surgical History:  Procedure Laterality Date  . [place]   . CYSTOSCOPY/URETEROSCOPY/HOLMIUM LASER/STENT PLACEMENT  2002  . EXTRACORPOREAL SHOCK WAVE LITHOTRIPSY Right 06/29/2015   Procedure: EXTRACORPOREAL SHOCK WAVE LITHOTRIPSY (ESWL);  Surgeon: Nickie Retort, MD;  Location: [facility] ORS;  Service: Urology;  Laterality: Right;    Family History  Problem Relation Age of Onset  . Prostate cancer Neg Hx   . Bladder Cancer Neg Hx   . Kidney cancer Neg Hx     Social History:  reports that he has been smoking cigarettes. He has a 33.00 pack-year smoking history. He has never used smokeless tobacco. He reports current alcohol use. He reports that he does not use drugs.  He has smoked 1/2 pack per week since the age of 8.  He drinks alcohol "now and then".  He is a Physiological scientist.  He denies any  exposure to toxins. His wife has cancer. The patient is alone today.  Allergies: No Known Allergies  Current Medications: Current Outpatient Medications  Medication Sig Dispense Refill  . amLODipine (NORVASC) 5 MG tablet TAKE 1 TABLET BY MOUTH EVERY DAY    . gabapentin (NEURONTIN) 300 MG capsule Take 600 mg by mouth at bedtime.  11  . Garlic 10 MG CAPS Take by mouth.    . vitamin B-12 1000 MCG tablet Take 1 tablet (1,000 mcg  total) by mouth daily. 30 tablet 1  . nicotine (NICODERM CQ - DOSED IN MG/24 HOURS) 21 mg/24hr patch Place 1 patch (21 mg total) onto the skin daily. (Patient not taking: Reported on 09/01/2019) 28 patch 0   No current facility-administered medications for this visit.    Review of Systems  Constitutional: Positive for malaise/fatigue. Negative for chills (with associated shakes on 08/31/2019 ), diaphoresis, fever and weight loss (up 5 lbs).       "Not to bad".  HENT: Negative for congestion, ear pain, hearing loss, nosebleeds, sinus pain and sore throat.   Eyes: Negative.  Negative for blurred vision and double vision.  Respiratory: Negative.  Negative for cough, hemoptysis, sputum production, shortness of breath and wheezing.   Cardiovascular: Positive for leg swelling (left ankle). Negative for chest pain and palpitations.  Gastrointestinal: Negative.  Negative for abdominal pain, blood in stool, constipation, diarrhea, heartburn, nausea and vomiting.  Genitourinary: Negative.  Negative for dysuria, frequency, hematuria and urgency.  Musculoskeletal: Negative.  Negative for back pain, falls, joint pain, myalgias and neck pain.  Skin: Negative.  Negative for rash.  Neurological: Positive for weakness (generalized). Negative for dizziness, sensory change, speech change, focal weakness and headaches.  Endo/Heme/Allergies: Negative.  Does not bruise/bleed easily.  Psychiatric/Behavioral: Negative.  Negative for depression and memory loss. The patient is not nervous/anxious and does not have insomnia.   All other systems reviewed and are negative.  Performance status (ECOG):  1  Vitals Blood pressure (!) 141/78, pulse 80, temperature 97.7 F (36.5 C), temperature source Tympanic, resp. rate 18, height 5\' 9"  (1.753 m), weight 178 lb 3.9 oz (80.8 kg), SpO2 99 %.   Physical Exam  Constitutional: He is oriented to person, place, and time. He appears well-developed and well-nourished. No  distress.  HENT:  Head: Normocephalic and atraumatic.  Mouth/Throat: Oropharynx is clear and moist. No oropharyngeal exudate.  Black cap.  Gray hair.  Mask.  Eyes: Pupils are equal, round, and reactive to light. Conjunctivae and EOM are normal. No scleral icterus.  Glasses.  Neck: No JVD present.  Cardiovascular: Normal rate, regular rhythm and normal heart sounds.  No murmur heard. Pulmonary/Chest: Effort normal and breath sounds normal. No respiratory distress. He has no wheezes. He has no rales. He exhibits no tenderness.  Abdominal: Soft. Bowel sounds are normal. He exhibits no distension and no mass. There is no hepatosplenomegaly. There is no abdominal tenderness. There is no rebound and no guarding.  Musculoskeletal:        General: No tenderness or edema. Normal range of motion.     Cervical back: Normal range of motion and neck supple.     Comments: Right leg > left leg secondary to mild left leg atrophy (old).  Lymphadenopathy:       Head (right side): No preauricular, no posterior auricular and no occipital adenopathy present.       Head (left side): No preauricular, no posterior auricular and no occipital adenopathy present.    He  has no cervical adenopathy.    He has no axillary adenopathy.       Right: No supraclavicular adenopathy present.       Left: No supraclavicular adenopathy present.  Neurological: He is alert and oriented to person, place, and time. No cranial nerve deficit or sensory deficit. He exhibits normal muscle tone. He displays a negative Romberg sign.  Skin: Skin is warm and dry. No rash noted. He is not diaphoretic. No erythema. No pallor.  Psychiatric: He has a normal mood and affect. His behavior is normal. Judgment and thought content normal.  Nursing note and vitals reviewed.   Appointment on 09/02/2019  Component Date Value Ref Range Status  . WBC 09/02/2019 6.8  4.0 - 10.5 K/uL Final  . RBC 09/02/2019 5.04  4.22 - 5.81 MIL/uL Final  . Hemoglobin  09/02/2019 14.7  13.0 - 17.0 g/dL Final  . HCT 09/02/2019 43.6  39.0 - 52.0 % Final  . MCV 09/02/2019 86.5  80.0 - 100.0 fL Final  . MCH 09/02/2019 29.2  26.0 - 34.0 pg Final  . MCHC 09/02/2019 33.7  30.0 - 36.0 g/dL Final  . RDW 09/02/2019 13.2  11.5 - 15.5 % Final  . Platelets 09/02/2019 185  150 - 400 K/uL Final  . nRBC 09/02/2019 0.0  0.0 - 0.2 % Final  . Neutrophils Relative % 09/02/2019 68  % Final  . Neutro Abs 09/02/2019 4.7  1.7 - 7.7 K/uL Final  . Lymphocytes Relative 09/02/2019 22  % Final  . Lymphs Abs 09/02/2019 1.5  0.7 - 4.0 K/uL Final  . Monocytes Relative 09/02/2019 7  % Final  . Monocytes Absolute 09/02/2019 0.5  0.1 - 1.0 K/uL Final  . Eosinophils Relative 09/02/2019 2  % Final  . Eosinophils Absolute 09/02/2019 0.1  0.0 - 0.5 K/uL Final  . Basophils Relative 09/02/2019 1  % Final  . Basophils Absolute 09/02/2019 0.1  0.0 - 0.1 K/uL Final  . Immature Granulocytes 09/02/2019 0  % Final  . Abs Immature Granulocytes 09/02/2019 0.02  0.00 - 0.07 K/uL Final   Performed at [facility], [address], [place], [place] [address]    Assessment:  Martin [name] is a 64 y.o. male with leukopenia and thrombocytopenia of unclear duration.  He presented with rigors and generalized weakness.  He denies any new medications or herbal products.  He denies alcohol use.  Work-up on 05/26/2019 revealed the following normal labs: HIV, COVID-19 negative, PT, PTT, fibrinogen, folate, ANA, hepatitis B and C testing, fibrinogen, copper.  B12 was 223 (low).  Peripheral smear revealed absolute leukopenia with lymphopenia.  RBCs were normochromic, normocytic with unremarkable morphology.  There were no schistocytes.  There were rare giant platelets and no blasts.   He has B12 deficiency.  B12 was 223 on 05/25/2019.  He began oral B12 on 05/27/2019.   Intrinsic factor and anti-parietal antibody were normal on 06/03/2019.  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   Right lower extremity duplex on 05/27/2019 revealed no evidence of DVT.  He has a 33 pack year smoking history.   Lung cancer screening chest CT on 08/04/2019 revealed Lung-RADS 2, benign appearance or behavior.   Patient is unsure if he wants to receive the COVID-19 vaccine.   Symptomatically, he denies any fevers, sweats or weight loss.  He denies any infections.  Exam is stable.  Plan: 1.   Labs today: CBC with diff. 2.   Leukopenia, resolved  WBC 1300 with an [place] 1000 on 05/25/2019.             WBC 4600 with an [place] 3400 on 05/26/2019.  WBC 6800 with an [place] 4700 on 09/02/2019. 3.   Thrombocytopenia, resolved             Platelet count is 185,000.  Etiology was felt secondary to transient marrow suppression.             Peripheral smear was unremarkable.             HIV and COVID-19 were negative.             PT 17.1 (INR 1.4) and PTT 46 (24-36).    Repeat PTT was 30.2 on 05/27/2019.  B12 was 223 (low).             Normal studies included folate, ANA, hepatitis B and C, fibrinogen and copper. 4.   B12 deficiency  B12 was 223 on 05/26/2019 and 830 on 06/28/2019.  He began oral B12 on 05/27/2019.  Testing was negative for for pernicious anemia. 5.   RTC prn.  I discussed the assessment and treatment plan with the patient.  The patient was provided an opportunity to ask questions and all were answered.  The patient agreed with the plan and demonstrated an understanding of the instructions.  The patient was advised to call back if the symptoms worsen or if the condition fails to improve as anticipated.  I provided 12 minutes of face-to-face time during this this encounter and > 50% was spent counseling as documented under my assessment and plan.    Lequita Asal, MD, PhD    09/02/2019, 2:27 PM  I, Selena Batten, am acting as a scribe for Lequita Asal, MD.  I, Zena Mike Gip, MD, have reviewed the above documentation for accuracy and completeness, and I agree  with the above.

## 2019-09-01 ENCOUNTER — Other Ambulatory Visit: Payer: Self-pay

## 2019-09-01 NOTE — Progress Notes (Signed)
Confirmed patient name and DOB for phone assessment. Patient states that he is feeling tired and gets tired easy. He recently had an ECHO and leg ultrasound to check his heart and rule out clots. He has not heard anything back from these tests.   He states that yesterday he started having chills and shakes but felt better when he woke up this morning.

## 2019-09-02 ENCOUNTER — Inpatient Hospital Stay: Payer: BC Managed Care – PPO | Attending: Hematology and Oncology | Admitting: Hematology and Oncology

## 2019-09-02 ENCOUNTER — Encounter: Payer: Self-pay | Admitting: Hematology and Oncology

## 2019-09-02 ENCOUNTER — Inpatient Hospital Stay: Payer: BC Managed Care – PPO

## 2019-09-02 VITALS — BP 141/78 | HR 80 | Temp 97.7°F | Resp 18 | Ht 69.0 in | Wt 178.2 lb

## 2019-09-02 DIAGNOSIS — E538 Deficiency of other specified B group vitamins: Secondary | ICD-10-CM

## 2019-09-02 DIAGNOSIS — D72819 Decreased white blood cell count, unspecified: Secondary | ICD-10-CM | POA: Diagnosis not present

## 2019-09-02 DIAGNOSIS — F17210 Nicotine dependence, cigarettes, uncomplicated: Secondary | ICD-10-CM | POA: Insufficient documentation

## 2019-09-02 DIAGNOSIS — D696 Thrombocytopenia, unspecified: Secondary | ICD-10-CM

## 2019-09-02 DIAGNOSIS — Z720 Tobacco use: Secondary | ICD-10-CM

## 2019-09-02 LAB — CBC WITH DIFFERENTIAL/PLATELET
Abs Immature Granulocytes: 0.02 10*3/uL (ref 0.00–0.07)
Basophils Absolute: 0.1 10*3/uL (ref 0.0–0.1)
Basophils Relative: 1 %
Eosinophils Absolute: 0.1 10*3/uL (ref 0.0–0.5)
Eosinophils Relative: 2 %
HCT: 43.6 % (ref 39.0–52.0)
Hemoglobin: 14.7 g/dL (ref 13.0–17.0)
Immature Granulocytes: 0 %
Lymphocytes Relative: 22 %
Lymphs Abs: 1.5 10*3/uL (ref 0.7–4.0)
MCH: 29.2 pg (ref 26.0–34.0)
MCHC: 33.7 g/dL (ref 30.0–36.0)
MCV: 86.5 fL (ref 80.0–100.0)
Monocytes Absolute: 0.5 10*3/uL (ref 0.1–1.0)
Monocytes Relative: 7 %
Neutro Abs: 4.7 10*3/uL (ref 1.7–7.7)
Neutrophils Relative %: 68 %
Platelets: 185 10*3/uL (ref 150–400)
RBC: 5.04 MIL/uL (ref 4.22–5.81)
RDW: 13.2 % (ref 11.5–15.5)
WBC: 6.8 10*3/uL (ref 4.0–10.5)
nRBC: 0 % (ref 0.0–0.2)

## 2019-10-31 DIAGNOSIS — E538 Deficiency of other specified B group vitamins: Secondary | ICD-10-CM | POA: Insufficient documentation

## 2019-11-18 ENCOUNTER — Other Ambulatory Visit: Payer: Self-pay | Admitting: Internal Medicine

## 2019-11-18 DIAGNOSIS — R413 Other amnesia: Secondary | ICD-10-CM

## 2019-12-02 ENCOUNTER — Ambulatory Visit: Payer: BC Managed Care – PPO

## 2019-12-30 ENCOUNTER — Other Ambulatory Visit: Payer: Self-pay

## 2019-12-30 ENCOUNTER — Ambulatory Visit
Admission: RE | Admit: 2019-12-30 | Discharge: 2019-12-30 | Disposition: A | Discharge status: Home / Self Care | Payer: BC Managed Care – PPO | Source: Ambulatory Visit | Attending: Internal Medicine | Admitting: Internal Medicine

## 2019-12-30 DIAGNOSIS — R413 Other amnesia: Secondary | ICD-10-CM | POA: Diagnosis present

## 2019-12-30 MED ORDER — GADOBUTROL 1 MMOL/ML IV SOLN
9.0000 mL | Freq: Once | INTRAVENOUS | Status: AC | PRN
  Administered 2019-12-30: 9 mL via INTRAVENOUS

## 2020-01-11 ENCOUNTER — Encounter: Payer: Self-pay | Admitting: *Deleted

## 2020-01-11 ENCOUNTER — Other Ambulatory Visit: Payer: Self-pay

## 2020-01-11 ENCOUNTER — Emergency Department
Admission: EM | Admit: 2020-01-11 | Discharge: 2020-01-11 | Disposition: A | Discharge status: Home / Self Care | Payer: BC Managed Care – PPO | Attending: Emergency Medicine | Admitting: Emergency Medicine

## 2020-01-11 ENCOUNTER — Emergency Department: Payer: BC Managed Care – PPO

## 2020-01-11 DIAGNOSIS — F17210 Nicotine dependence, cigarettes, uncomplicated: Secondary | ICD-10-CM | POA: Insufficient documentation

## 2020-01-11 DIAGNOSIS — R531 Weakness: Secondary | ICD-10-CM | POA: Diagnosis not present

## 2020-01-11 DIAGNOSIS — Z79899 Other long term (current) drug therapy: Secondary | ICD-10-CM | POA: Insufficient documentation

## 2020-01-11 DIAGNOSIS — N182 Chronic kidney disease, stage 2 (mild): Secondary | ICD-10-CM | POA: Insufficient documentation

## 2020-01-11 DIAGNOSIS — Z20822 Contact with and (suspected) exposure to covid-19: Secondary | ICD-10-CM | POA: Insufficient documentation

## 2020-01-11 LAB — BASIC METABOLIC PANEL
Anion gap: 12 (ref 5–15)
BUN: 18 mg/dL (ref 8–23)
CO2: 23 mmol/L (ref 22–32)
Calcium: 9.8 mg/dL (ref 8.9–10.3)
Chloride: 103 mmol/L (ref 98–111)
Creatinine, Ser: 1.15 mg/dL (ref 0.61–1.24)
GFR calc Af Amer: >60 mL/min (ref >60)
GFR calc non Af Amer: >60 mL/min (ref >60)
Glucose, Bld: 119 mg/dL — ABNORMAL HIGH (ref 70–99)
Potassium: 4.4 mmol/L (ref 3.5–5.1)
Sodium: 138 mmol/L (ref 135–145)

## 2020-01-11 LAB — CBC
HCT: 45.1 % (ref 39.0–52.0)
Hemoglobin: 15.8 g/dL (ref 13.0–17.0)
MCH: 30.7 pg (ref 26.0–34.0)
MCHC: 35 g/dL (ref 30.0–36.0)
MCV: 87.7 fL (ref 80.0–100.0)
Platelets: 201 10*3/uL (ref 150–400)
RBC: 5.14 MIL/uL (ref 4.22–5.81)
RDW: 12.8 % (ref 11.5–15.5)
WBC: 6.7 10*3/uL (ref 4.0–10.5)
nRBC: 0 % (ref 0.0–0.2)

## 2020-01-11 LAB — URINALYSIS, COMPLETE (UACMP) WITH MICROSCOPIC
Bacteria, UA: NONE SEEN
Bilirubin Urine: NEGATIVE
Glucose, UA: NEGATIVE mg/dL
Hgb urine dipstick: NEGATIVE
Ketones, ur: NEGATIVE mg/dL
Leukocytes,Ua: NEGATIVE
Nitrite: NEGATIVE
Protein, ur: NEGATIVE mg/dL
Specific Gravity, Urine: 1.017 (ref 1.005–1.030)
Squamous Epithelial / HPF: NONE SEEN (ref 0–5)
pH: 6 (ref 5.0–8.0)

## 2020-01-11 LAB — TROPONIN I (HIGH SENSITIVITY): Troponin I (High Sensitivity): 7 ng/L (ref <18)

## 2020-01-11 LAB — SARS CORONAVIRUS 2 BY RT PCR (HOSPITAL ORDER, PERFORMED IN ~~LOC~~ HOSPITAL LAB): SARS Coronavirus 2: NEGATIVE

## 2020-01-11 MED ORDER — SODIUM CHLORIDE 0.9 % IV BOLUS
500.0000 mL | Freq: Once | INTRAVENOUS | Status: AC
  Administered 2020-01-11: 500 mL via INTRAVENOUS

## 2020-01-11 MED ORDER — ONDANSETRON HCL 4 MG/2ML IJ SOLN
4.0000 mg | Freq: Once | INTRAMUSCULAR | Status: AC
  Administered 2020-01-11: 4 mg via INTRAVENOUS
  Filled 2020-01-11: qty 2

## 2020-01-11 NOTE — ED Notes (Signed)
Martin [name] EDT ambulated pt in room. Pt stayed between 97-99% RA.

## 2020-01-11 NOTE — ED Provider Notes (Signed)
[facility] Emergency Department Provider Note   ____________________________________________   None    (approximate)  I have reviewed the triage vital signs and the nursing notes.   HISTORY  Chief Complaint Weakness    HPI Martin [name] is a 64 y.o. male history of chronic kidney disease neuropathy B12 deficiency new line  Patient reports for about the last 12-24 hrs. has been feeling increased fatigue.   Reports he feels very tired.  Is able to walk, but just feels very weak in general.  Having some body aches.  Denies chest pain or trouble breathing.  Slight nausea decreased appetite.  No vomiting.  No diarrhea.  Reports he just feels very tired and fatigued.  A little similar to how he felt in January when he was hospitalized and recovered well  He does report he has a mild tremor of unknown cause, but his primary doctor follows him for that.  He does have a history of some weakness in his legs, and he just feels very tired.  He still able to get up and walk on his own, but reports that he just feels rundown   EMS did have to assist him to ambulate today  He is not vaccinated against COVID-19, and reports his daughter who he lives with was diagnosed with Covid 2 weeks ago.  Past Medical History:  Diagnosis Date  . 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 Cervical spinal stenosis 05/09/2014  . Kidney stone   . Neuropathy     Patient Active Problem List   Diagnosis Date Noted  . B12 deficiency 10/31/2019  . Leg weakness, bilateral 05/25/2019  . Lymphopenia 05/25/2019  . Thrombocytopenia ([place]) 05/25/2019  . Tobacco abuse 05/25/2019  . 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 Rigors   . Cervical spinal stenosis 05/09/2014  . 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 [place]   . CYSTOSCOPY/URETEROSCOPY/HOLMIUM LASER/STENT PLACEMENT  2002  .  EXTRACORPOREAL SHOCK WAVE LITHOTRIPSY Right 06/29/2015   Procedure: EXTRACORPOREAL SHOCK WAVE LITHOTRIPSY (ESWL);  Surgeon: Nickie Retort, MD;  Location: [facility] ORS;  Service: Urology;  Laterality: Right;    Prior to Admission medications   Medication Sig Start Date End Date Taking? Authorizing Provider  amLODipine (NORVASC) 5 MG tablet TAKE 1 TABLET BY MOUTH EVERY DAY 08/05/19   [provider]  gabapentin (NEURONTIN) 300 MG capsule Take 600 mg by mouth at bedtime. 06/09/15   [provider]  Garlic 10 MG CAPS Take by mouth.    [provider]  nicotine (NICODERM CQ - DOSED IN MG/24 HOURS) 21 mg/24hr patch Place 1 patch (21 mg total) onto the skin daily. Patient not taking: Reported on 09/01/2019 05/28/19   Kathie Dike, MD  vitamin B-12 1000 MCG tablet Take 1 tablet (1,000 mcg total) by mouth daily. 05/28/19   Kathie Dike, MD    Allergies Patient has no known allergies.  Family History  Problem Relation Age of Onset  . Prostate cancer Neg Hx   . Bladder Cancer Neg Hx   . Kidney cancer Neg Hx     Social History Social History   Tobacco Use  . Smoking status: Current Every Day Smoker    Packs/day: 0.75    Years: 44.00    Pack years: 33.00    Types: Cigarettes  . Smokeless tobacco: Never Used  Vaping Use  .  Vaping Use: Never used  Substance Use Topics  . Alcohol use: Not Currently    Alcohol/week: 0.0 standard drinks    Comment: rarely  . Drug use: No    Review of Systems Constitutional: No fever/chills but feeling generally fatigued Eyes: No visual changes. ENT: No sore throat. Cardiovascular: Denies chest pain. Respiratory: Denies shortness of breath. Gastrointestinal: No abdominal pain.   Genitourinary: Negative for dysuria. Musculoskeletal: Negative for back pain.  Feels achy, particularly across the muscles of his back chest arms and legs. Skin: Negative for rash. Neurological: Negative for headaches, areas of focal weakness or  numbness.  He does however report that he just feels weak in his arms and legs in general.  No weakness in his face.  No trouble speaking.  No history of myasthenia    ____________________________________________   PHYSICAL EXAM:  VITAL SIGNS: ED Triage Vitals  Enc Vitals Group     BP 01/11/20 0205 (!) 123/96     Pulse Rate 01/11/20 0205 72     Resp 01/11/20 0205 (!) 22     Temp 01/11/20 0205 98 F (36.7 C)     Temp Source 01/11/20 0205 Oral     SpO2 01/11/20 0205 100 %     Weight 01/11/20 0206 197 lb (89.4 kg)     Height 01/11/20 0206 5\' 9"  (1.753 m)     Head Circumference --      Peak Flow --      Pain Score 01/11/20 0205 9     Pain Loc --      Pain Edu? --      Excl. in [place]? --     Constitutional: Alert and oriented. Well appearing and in no acute distress.  He is resting comfortably pleasant. Eyes: Conjunctivae are normal. Head: Atraumatic. Nose: No congestion/rhinnorhea. Mouth/Throat: Mucous membranes are slightly dry. Neck: No stridor.  Cardiovascular: Normal rate, regular rhythm. Grossly normal heart sounds.  Good peripheral circulation. Respiratory: Normal respiratory effort.  No retractions. Lungs CTAB. Gastrointestinal: Soft and nontender. No distention. Musculoskeletal: No lower extremity tenderness nor edema. Neurologic:  Normal speech and language. No gross focal neurologic deficits are appreciated.  He demonstrates 5 out of 5 strength in all extremities.  He has normal patellar reflexes bilateral.  He reports normal sensation to light touch over the lower legs and arms bilaterally.  He has normal facial expression.  He does not currently exhibit any tremors, but reports that come intermittently. Skin:  Skin is warm, dry and intact. No rash noted. Psychiatric: Mood and affect are normal. Speech and behavior are normal.  ____________________________________________   LABS (all labs ordered are listed, but only abnormal results are displayed)  Labs Reviewed    BASIC METABOLIC PANEL - Abnormal; Notable for the following components:      Result Value   Glucose, Bld 119 (*)    All other components within normal limits  URINALYSIS, COMPLETE (UACMP) WITH MICROSCOPIC - Abnormal; Notable for the following components:   Color, Urine YELLOW (*)    APPearance CLEAR (*)    All other components within normal limits  SARS CORONAVIRUS 2 BY RT PCR (HOSPITAL ORDER, [place] [facility])  CBC  TROPONIN I (HIGH SENSITIVITY)  TROPONIN I (HIGH SENSITIVITY)   ____________________________________________  EKG  Reviewed inter by me from EKG at 2:11 AM Heart rate 65 QRS 90 QTc 400 Normal sinus rhythm, slight baseline tremor suspected, favor normal sinus rhythm as opposed to a mild atrial arrhythmia such  as flutter.  Patient's telemetry tracing reviewed later in his visit clearly demonstrates normal sinus rhythm.  No evidence of acute ischemia ____________________________________________  RADIOLOGY  DG Chest 2 View  Result Date: 01/11/2020 CLINICAL DATA:  Weakness and body aches EXAM: CHEST - 2 VIEW COMPARISON:  06/07/2019 FINDINGS: The heart size and mediastinal contours are within normal limits. Both lungs are clear. The visualized skeletal structures are unremarkable. IMPRESSION: No active cardiopulmonary disease. Electronically Signed   By: Ulyses Jarred M.D.   On: 01/11/2020 02:45    Chest x-ray reviewed negative for acute ____________________________________________   PROCEDURES  Procedure(s) performed:  3-lead  .1-3 Lead EKG Interpretation Performed by: Delman Kitten, MD Authorized by: Delman Kitten, MD     Interpretation: normal     ECG rate:  55   ECG rate assessment: normal     Rhythm: sinus rhythm     Ectopy: none     Conduction: normal   Comments:     Patient not currently having any tremors.  Telemetry clearly demonstrates normal sinus rhythm.     Critical Care performed:  No  ____________________________________________   INITIAL IMPRESSION / ASSESSMENT AND PLAN / ED COURSE  Pertinent labs & imaging results that were available during my care of the patient were reviewed by me and considered in my medical decision making (see chart for details).   Patient presents with generalized fatigue.  Does not exhibit any obvious acute neurologic symptoms or deficits.  No associated headache.  Reports mild nausea, decreased appetite.  Very reassuring exam and vital signs.  On exam I do not see evidence of a central or peripheral acute neurologic process such as demyelinating disease, myasthenia, or acute neurologic process.  His lab works very reassuring today.  His previous thrombocytopenia and leukopenia has improved.  His lab work reassuring.  I am concerned about a possible Covid or previral syndrome, he is not hypoxic.  Demonstrates no evidence of acute pulmonary disease on exam or by clinical history.  Denies any chest pain or associated cardiac symptoms.  Check urinalysis.  I see no evidence of focal bacterial infection.  Patient denies any insect or tick bites.  Megan Mans was evaluated in Emergency Department on 01/11/2020 for the symptoms described in the history of present illness. He was evaluated in the context of the global COVID-19 pandemic, which necessitated consideration that the patient might be at risk for infection with the SARS-CoV-2 virus that causes COVID-19. Institutional protocols and algorithms that pertain to the evaluation of patients at risk for COVID-19 are in a state of rapid change based on information released by regulatory bodies including the CDC and federal and state organizations. These policies and algorithms were followed during the patient's care in the ED.   Clinical Course as of Jan 10 1225  Tue Jan 11, 2020  1025 Resting comfortably, patient feels improved.  Patient feels reassured that his Covid test is negative at this point.   However I did discuss with him that given his daughter's diagnosis of COVID-19 whom he lives with this could still represent early signs or symptoms of illness, and to take careful return precautions.  Discussed with the patient who is in agreement   [MQ]    Clinical Course User Index [MQ] Delman Kitten, MD    ----------------------------------------- 12:25 PM on 01/11/2020 -----------------------------------------  Patient able to ambulate, no acute distress.  Appears improved.  Daughter at bedside as well, reports that he has had symptoms like this they seem  to come and recurrent nature having several episodes that are similar where he will feel weak for about a day and he seems to improve.  Has excellent appointment with neurology coming up next week for same issue and has seen his PCP for this in the past as well.  Patient and daughter both comfortable with plan for discharge with careful return precautions.  I think this is appropriate.  He is fully awake and alert in no distress and improving.  Could be viral prodrome, could also be fatigue or even some element of work exhaustion as he reports he works 7 days a week almost every day, and has had similar episodes in the past recovers well usually after about a day of rest  Return precautions and treatment recommendations and follow-up discussed with the patient who is agreeable with the plan.  ____________________________________________   FINAL CLINICAL IMPRESSION(S) / ED DIAGNOSES  Final diagnoses:  Generalized weakness        Note:  This document was prepared using Dragon voice recognition software and may include unintentional dictation errors       Delman Kitten, MD 01/11/20 1226

## 2020-01-11 NOTE — ED Notes (Signed)
Pt provided breakfast tray.

## 2020-01-11 NOTE — ED Triage Notes (Signed)
Pt brought in via ems from home with bodyaches and feeling weak.  Pt has tremors.   No n/v/d.  Sx for 2 days   Pt alert  Speech clear.

## 2020-07-13 ENCOUNTER — Telehealth: Payer: Self-pay | Admitting: *Deleted

## 2020-07-13 NOTE — Telephone Encounter (Signed)
Attempted to contact patient to schedule lung screening. Left message to call Shawn at [phone]. 

## 2020-07-14 ENCOUNTER — Telehealth: Payer: Self-pay | Admitting: Licensed Clinical Social Worker

## 2020-07-14 NOTE — Telephone Encounter (Signed)
I attempted to call the patient and did not receive a voicemail. No message left. Will have to try back later.

## 2020-07-17 ENCOUNTER — Telehealth: Payer: Self-pay | Admitting: *Deleted

## 2020-07-17 NOTE — Telephone Encounter (Signed)
Attempted to call patient for annual lung screening. No answer and no voicemail available. 

## 2020-08-03 ENCOUNTER — Telehealth: Payer: Self-pay

## 2020-08-03 NOTE — Telephone Encounter (Signed)
Attempted to call patient for annual lung screening. No answer and no voicemail available.

## 2020-08-08 ENCOUNTER — Encounter: Payer: Self-pay | Admitting: *Deleted

## 2022-03-05 IMAGING — MR MR HEAD WO/W CM
14 series · 48 of 48 positions shown · IV contrast (gadavist)
Comparison: Head CT 02/13/2005

CLINICAL DATA: Memory loss.  Chilling sensation.

EXAM:
MRI HEAD WITHOUT AND WITH CONTRAST
TECHNIQUE: Multiplanar, multiecho pulse sequences of the brain and surrounding
structures were obtained without and with intravenous contrast.
CONTRAST:  9mL GADAVIST GADOBUTROL 1 MMOL/ML IV SOLN

[Series 5: ax dwi_tracew · axial · 3.0mm · 0.60mm/px · z∈[-90,+64]mm · 3 of 48 slices shown]
[im 1/48]
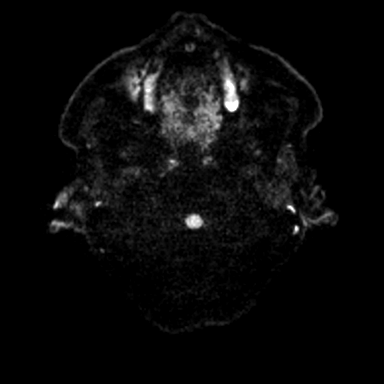
[im 24/48]
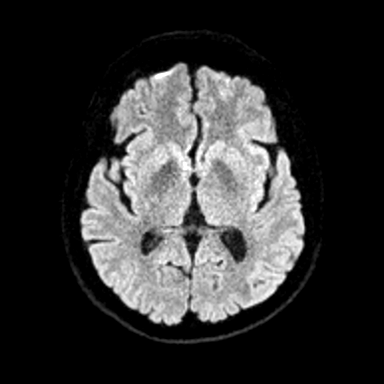
[im 48/48]
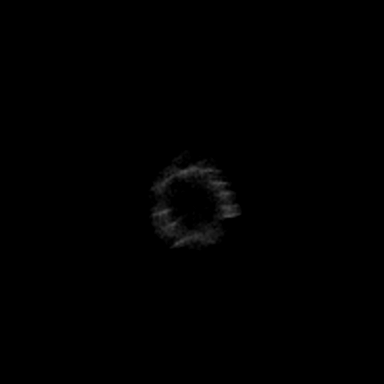

[Series 6: ax dwi_adc · axial · 3.0mm · 0.60mm/px · z∈[-90,+64]mm · 3 of 48 slices shown]
[im 1/48]
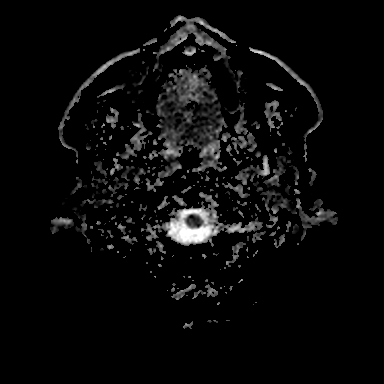
[im 24/48]
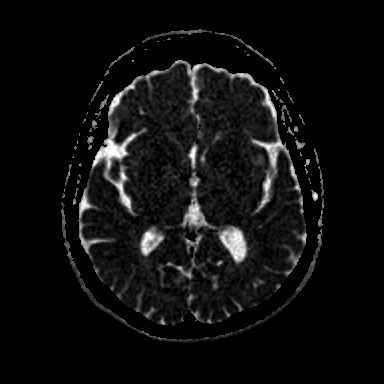
[im 48/48]
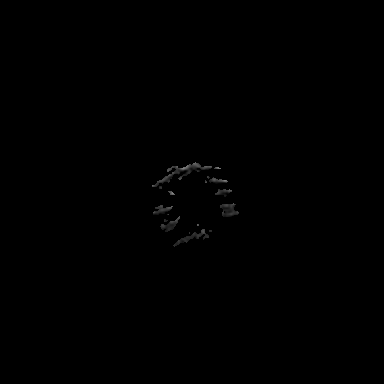

[Series 7: T1 · sagittal · 5.0mm · 0.62mm/px · 1 of 23 slices shown (1 of 2)]
[im 1/23]
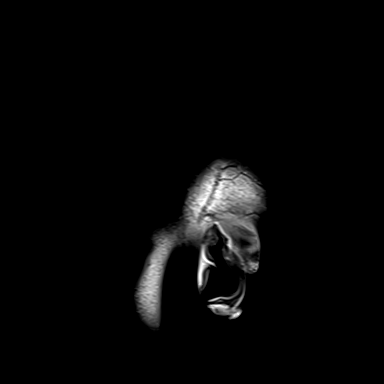

[Series 8: cor dwi_tracew · coronal · 5.0mm · 0.68mm/px · 2 of 40 slices shown]
[im 1/40]
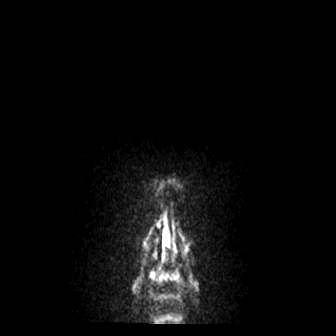
[im 40/40]
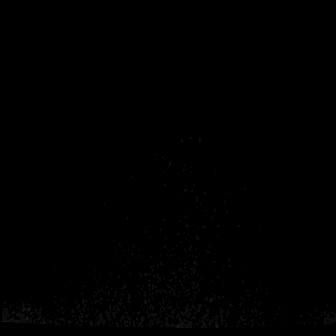

[Series 9: cor dwi_adc · coronal · 5.0mm · 0.68mm/px · 2 of 39 slices shown]
[im 1/39]
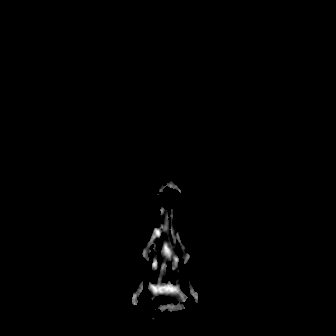
[im 39/39]
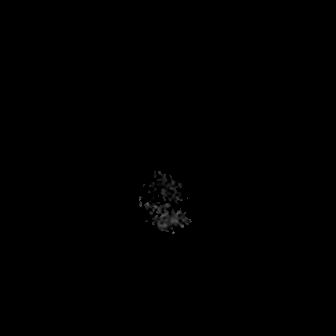

[Series 10: T2 · axial · 5.0mm · 0.53mm/px · 1 of 26 slices shown]
[im 1/26]
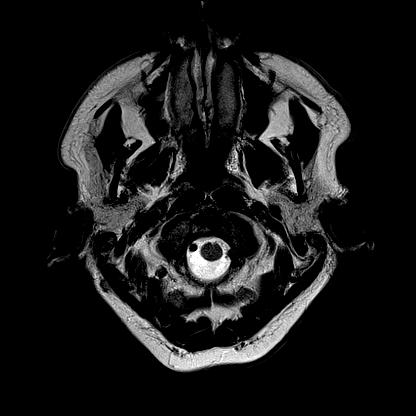

[Series 11: mag_images · axial · 3.0mm · 0.90mm/px · z∈[-94,+82]mm · 3 of 60 slices shown]
[im 1/60]
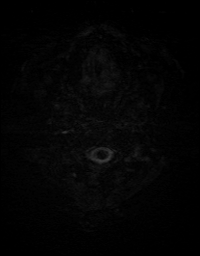
[im 30/60]
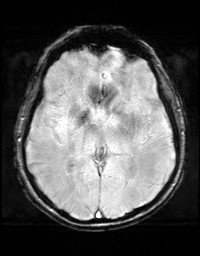
[im 60/60]
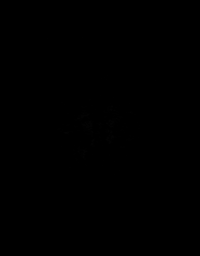

[Series 12: pha_images · axial · 3.0mm · 0.90mm/px · z∈[-94,+79]mm · 3 of 59 slices shown]
[im 1/59]
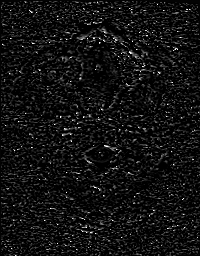
[im 30/59]
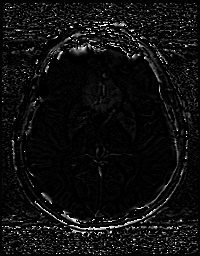
[im 59/59]
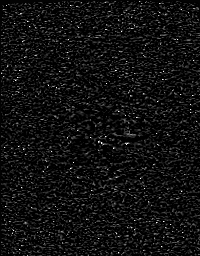

[Series 13: swi_images · axial · 3.0mm · 0.90mm/px · z∈[-94,+82]mm · 3 of 60 slices shown]
[im 1/60]
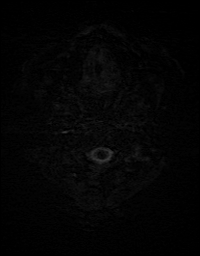
[im 30/60]
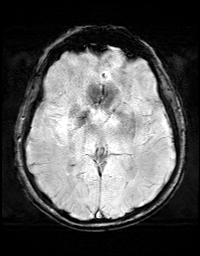
[im 60/60]
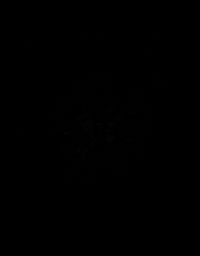

[Series 15: FLAIR · axial · 3.0mm · 0.53mm/px · z∈[-87,+75]mm · 3 of 55 slices shown]
[im 1/55]
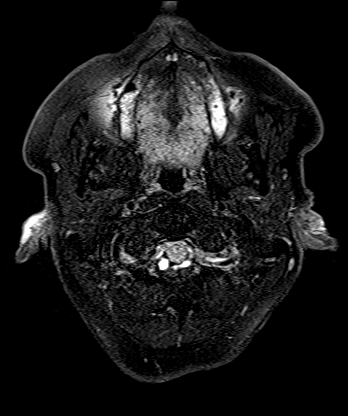
[im 28/55]
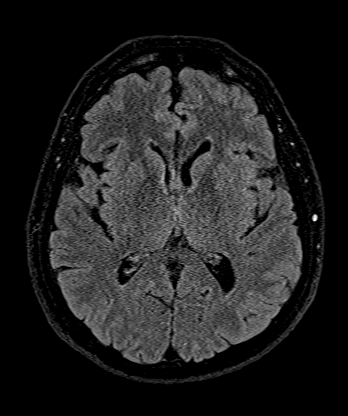
[im 55/55]
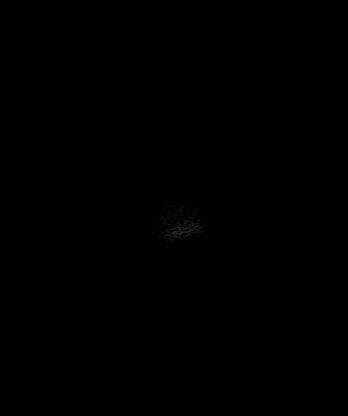

[Series 16: T1 · axial · 1.0mm · 0.98mm/px · z∈[-92,+80]mm · 10 of 172 slices shown (2 of 2)]
[im 1/172]
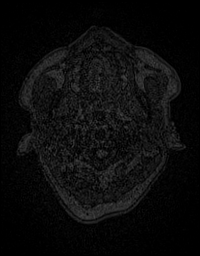
[im 20/172]
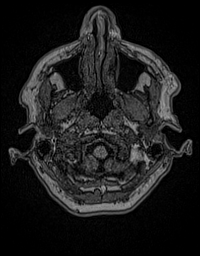
[im 39/172]
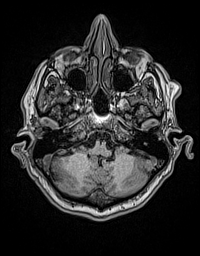
[im 58/172]
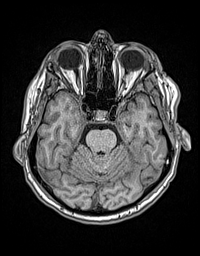
[im 77/172]
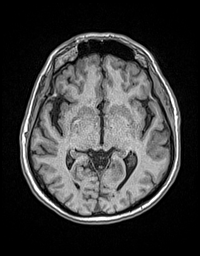
[im 96/172]
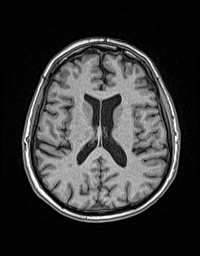
[im 115/172]
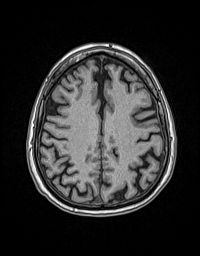
[im 134/172]
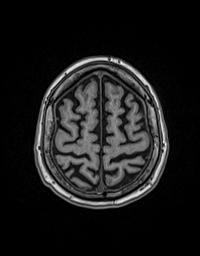
[im 153/172]
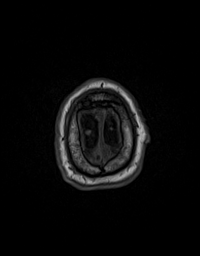
[im 172/172]
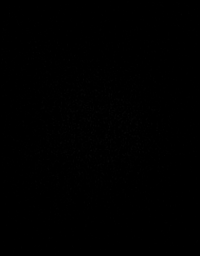

[Series 17: T2 post-contrast · coronal · 5.0mm · 0.57mm/px · 2 of 29 slices shown]
[im 1/29]
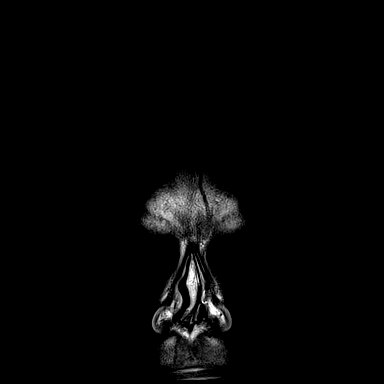
[im 29/29]
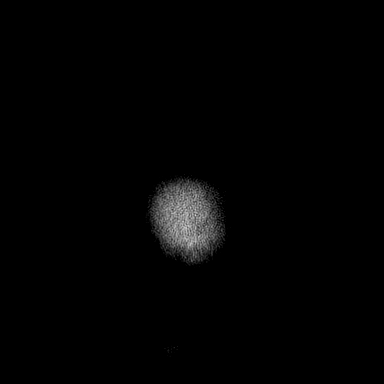

[Series 18: T1 post-contrast · axial · 1.0mm · 0.98mm/px · z∈[-92,+83]mm · 10 of 174 slices shown (1 of 2)]
[im 1/174]
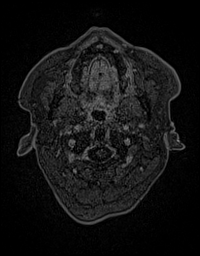
[im 20/174]
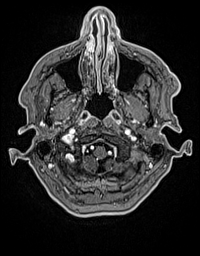
[im 39/174]
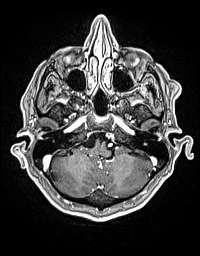
[im 58/174]
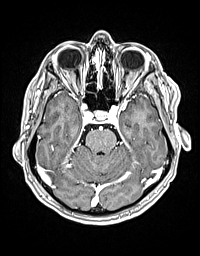
[im 77/174]
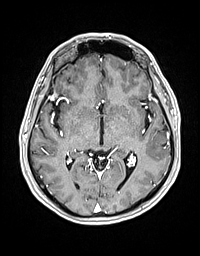
[im 97/174]
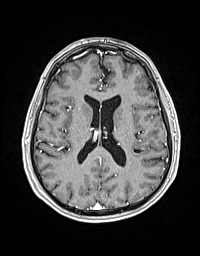
[im 116/174]
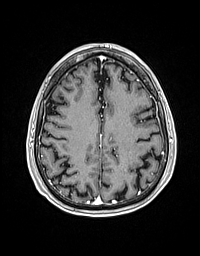
[im 135/174]
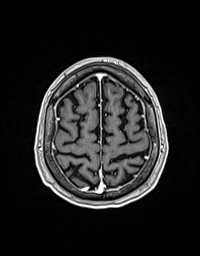
[im 154/174]
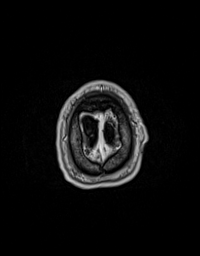
[im 174/174]
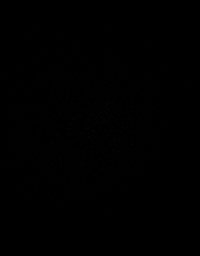

[Series 19: T1 post-contrast · coronal · 5.0mm · 0.57mm/px · 2 of 29 slices shown (2 of 2)]
[im 1/29]
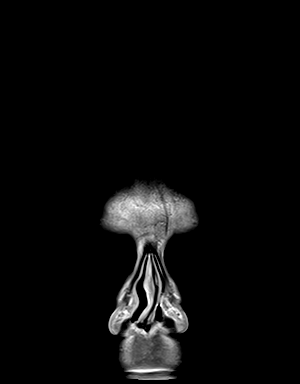
[im 29/29]
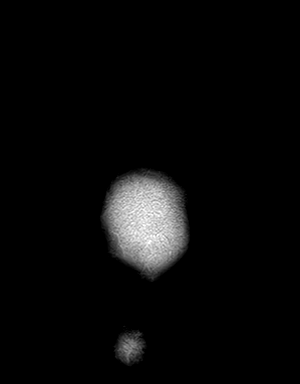

[48 of 48 positions shown; findings below may reference images not displayed]

FINDINGS: Brain: The brain has a normal appearance without evidence of
malformation, atrophy, old or acute small or large vessel
infarction, mass lesion, hemorrhage, hydrocephalus or extra-axial
collection. After contrast administration, no abnormal enhancement
occurs.

Vascular: Major vessels at the base of the brain show flow. Venous
sinuses appear patent.

Skull and upper cervical spine: Normal.

Sinuses/Orbits: Clear/normal.

Other: None significant.
IMPRESSION: Normal examination. No abnormality seen to explain the clinical
presentation.

## 2022-09-17 ENCOUNTER — Other Ambulatory Visit: Payer: Self-pay

## 2022-09-17 DIAGNOSIS — F0280 Dementia in other diseases classified elsewhere without behavioral disturbance: Secondary | ICD-10-CM

## 2022-10-03 ENCOUNTER — Other Ambulatory Visit: Payer: Self-pay | Admitting: Internal Medicine

## 2022-10-03 DIAGNOSIS — F17210 Nicotine dependence, cigarettes, uncomplicated: Secondary | ICD-10-CM

## 2022-10-09 ENCOUNTER — Other Ambulatory Visit: Payer: Self-pay | Admitting: Emergency Medicine

## 2022-10-09 DIAGNOSIS — Z87891 Personal history of nicotine dependence: Secondary | ICD-10-CM

## 2022-10-09 DIAGNOSIS — Z122 Encounter for screening for malignant neoplasm of respiratory organs: Secondary | ICD-10-CM

## 2022-10-09 DIAGNOSIS — F17210 Nicotine dependence, cigarettes, uncomplicated: Secondary | ICD-10-CM

## 2022-10-14 ENCOUNTER — Ambulatory Visit: Payer: BC Managed Care – PPO | Attending: Acute Care

## 2022-10-17 ENCOUNTER — Ambulatory Visit: Admission: RE | Admit: 2022-10-17 | Payer: BC Managed Care – PPO | Source: Ambulatory Visit

## 2023-05-05 ENCOUNTER — Encounter: Payer: Self-pay | Admitting: *Deleted

## 2023-05-28 ENCOUNTER — Other Ambulatory Visit: Payer: Self-pay

## 2023-05-28 ENCOUNTER — Emergency Department
Admission: EM | Admit: 2023-05-28 | Discharge: 2023-05-28 | Disposition: A | Discharge status: Home / Self Care | Payer: BC Managed Care – PPO | Attending: Emergency Medicine | Admitting: Emergency Medicine

## 2023-05-28 ENCOUNTER — Emergency Department: Payer: BC Managed Care – PPO

## 2023-05-28 ENCOUNTER — Encounter: Payer: Self-pay | Admitting: Emergency Medicine

## 2023-05-28 DIAGNOSIS — I129 Hypertensive chronic kidney disease with stage 1 through stage 4 chronic kidney disease, or unspecified chronic kidney disease: Secondary | ICD-10-CM | POA: Insufficient documentation

## 2023-05-28 DIAGNOSIS — N189 Chronic kidney disease, unspecified: Secondary | ICD-10-CM | POA: Diagnosis not present

## 2023-05-28 DIAGNOSIS — R319 Hematuria, unspecified: Secondary | ICD-10-CM | POA: Diagnosis present

## 2023-05-28 DIAGNOSIS — N201 Calculus of ureter: Secondary | ICD-10-CM | POA: Insufficient documentation

## 2023-05-28 LAB — URINALYSIS, ROUTINE W REFLEX MICROSCOPIC
Bacteria, UA: NONE SEEN
Bilirubin Urine: NEGATIVE
Glucose, UA: 50 mg/dL — AB
Ketones, ur: NEGATIVE mg/dL
Leukocytes,Ua: NEGATIVE
Nitrite: NEGATIVE
Protein, ur: 100 mg/dL — AB
RBC / HPF: >50 RBC/hpf (ref 0–5)
Specific Gravity, Urine: 1.024 (ref 1.005–1.030)
Squamous Epithelial / HPF: 0 /[HPF] (ref 0–5)
pH: 5 (ref 5.0–8.0)

## 2023-05-28 LAB — CBC WITH DIFFERENTIAL/PLATELET
Abs Immature Granulocytes: 0.02 10*3/uL (ref 0.00–0.07)
Basophils Absolute: 0 10*3/uL (ref 0.0–0.1)
Basophils Relative: 0 %
Eosinophils Absolute: 0.1 10*3/uL (ref 0.0–0.5)
Eosinophils Relative: 2 %
HCT: 42.6 % (ref 39.0–52.0)
Hemoglobin: 14.6 g/dL (ref 13.0–17.0)
Immature Granulocytes: 0 %
Lymphocytes Relative: 17 %
Lymphs Abs: 1.2 10*3/uL (ref 0.7–4.0)
MCH: 29.9 pg (ref 26.0–34.0)
MCHC: 34.3 g/dL (ref 30.0–36.0)
MCV: 87.1 fL (ref 80.0–100.0)
Monocytes Absolute: 0.5 10*3/uL (ref 0.1–1.0)
Monocytes Relative: 8 %
Neutro Abs: 4.9 10*3/uL (ref 1.7–7.7)
Neutrophils Relative %: 73 %
Platelets: 182 10*3/uL (ref 150–400)
RBC: 4.89 MIL/uL (ref 4.22–5.81)
RDW: 13.2 % (ref 11.5–15.5)
WBC: 6.8 10*3/uL (ref 4.0–10.5)
nRBC: 0 % (ref 0.0–0.2)

## 2023-05-28 LAB — BASIC METABOLIC PANEL
Anion gap: 8 (ref 5–15)
BUN: 16 mg/dL (ref 8–23)
CO2: 23 mmol/L (ref 22–32)
Calcium: 8.6 mg/dL — ABNORMAL LOW (ref 8.9–10.3)
Chloride: 109 mmol/L (ref 98–111)
Creatinine, Ser: 1.08 mg/dL (ref 0.61–1.24)
GFR, Estimated: >60 mL/min (ref >60)
Glucose, Bld: 111 mg/dL — ABNORMAL HIGH (ref 70–99)
Potassium: 4.2 mmol/L (ref 3.5–5.1)
Sodium: 140 mmol/L (ref 135–145)

## 2023-05-28 LAB — HEPATIC FUNCTION PANEL
ALT: 21 U/L (ref 0–44)
AST: 20 U/L (ref 15–41)
Albumin: 3.7 g/dL (ref 3.5–5.0)
Alkaline Phosphatase: 68 U/L (ref 38–126)
Bilirubin, Direct: 0.1 mg/dL (ref 0.0–0.2)
Indirect Bilirubin: 0.7 mg/dL (ref 0.3–0.9)
Total Bilirubin: 0.8 mg/dL (ref 0.0–1.2)
Total Protein: 6.6 g/dL (ref 6.5–8.1)

## 2023-05-28 MED ORDER — TRAMADOL HCL 50 MG PO TABS: 50.0000 mg | ORAL_TABLET | Freq: Four times a day (QID) | ORAL | 0 refills | Status: AC | PRN

## 2023-05-28 MED ORDER — TRAMADOL HCL 50 MG PO TABS
50.0000 mg | ORAL_TABLET | Freq: Once | ORAL | Status: AC
  Administered 2023-05-28: 50 mg via ORAL
  Filled 2023-05-28: qty 1

## 2023-05-28 NOTE — ED Provider Notes (Signed)
[facility] Provider Note    Event Date/Time   First MD Initiated Contact with Patient 05/28/23 [phone]     (approximate)   History   Dysuria   HPI  Martin [name] is a 68 y.o. male with a history of hypertension, CKD and cervical spinal stenosis who presents with hematuria intermittently over the last couple days, sometimes dark red and sometimes more orange.  He states he also has some urgency and frequency as well as pain to his lower abdomen.  A few days ago he was having pain to the right flank and right lower abdomen but this has resolved.  He denies any fever or chills.  I reviewed the past medical records.  The patient's most recent outpatient encounter was with internal medicine on 11/21 for follow-up of his hypertension and other chronic conditions.   Physical Exam   Triage Vital Signs: ED Triage Vitals  Encounter Vitals Group     BP 05/28/23 0810 (!) 154/82     Systolic BP Percentile --      Diastolic BP Percentile --      Pulse Rate 05/28/23 0810 68     Resp 05/28/23 0810 16     Temp 05/28/23 0810 97.6 F (36.4 C)     Temp Source 05/28/23 0810 Oral     SpO2 05/28/23 0810 96 %     Weight 05/28/23 0810 197 lb 1.5 oz (89.4 kg)     Height --      Head Circumference --      Peak Flow --      Pain Score 05/28/23 0809 10     Pain Loc --      Pain Education --      Exclude from Growth Chart --     Most recent vital signs: Vitals:   05/28/23 0810  BP: (!) 154/82  Pulse: 68  Resp: 16  Temp: 97.6 F (36.4 C)  SpO2: 96%     General: Awake, no distress.  CV:  Good peripheral perfusion.  Resp:  Normal effort.  Abd:  Soft and nontender.  No distention.  Other:  No jaundice or scleral icterus.   ED Results / Procedures / Treatments   Labs (all labs ordered are listed, but only abnormal results are displayed) Labs Reviewed  URINALYSIS, ROUTINE W REFLEX MICROSCOPIC - Abnormal; Notable for the following components:      Result  Value   Color, Urine YELLOW (*)    APPearance CLOUDY (*)    Glucose, UA 50 (*)    Hgb urine dipstick LARGE (*)    Protein, ur 100 (*)    All other components within normal limits  BASIC METABOLIC PANEL - Abnormal; Notable for the following components:   Glucose, Bld 111 (*)    Calcium 8.6 (*)    All other components within normal limits  CBC WITH DIFFERENTIAL/PLATELET  HEPATIC FUNCTION PANEL     EKG     RADIOLOGY  CT abdomen/pelvis: I independently viewed and interpreted the images; there is a right-sided ureteral stone with no hydronephrosis.  Radiology report indicates the following:   IMPRESSION:  1. There is a 3 mm right lower ureteric calculus without proximal  obstructive uropathy.  2. There are additional bilateral sub 4 mm nonobstructing  nephrolithiasis. No left ureterolithiasis. No obstructive uropathy  on either side.  3. Multiple other nonacute observations, as described above.    PROCEDURES:  Critical Care performed: No  Procedures  MEDICATIONS ORDERED IN ED: Medications  traMADol (ULTRAM) tablet 50 mg (has no administration in time range)     IMPRESSION / MDM / ASSESSMENT AND PLAN / ED COURSE  I reviewed the triage vital signs and the nursing notes.  68 year old male with PMH as noted above presents with hematuria over the last several days, initially with right-sided flank pain and now with suprapubic pain.  On exam the patient is overall well-appearing.  Vital signs are normal except for hypertension.  Abdomen soft and nontender.  Differential diagnosis includes, but is not limited to, ureteral stone, UTI, less likely hyperbilirubinemia or other non-urinary cause.  Urinalysis shows RBCs and some protein but no WBCs or bacteria.  We will obtain basic labs, CT, and reassess.  Patient's presentation is most consistent with acute complicated illness / injury requiring diagnostic workup.   ----------------------------------------- 9:55 AM on  05/28/2023 -----------------------------------------  CT shows a small nonobstructing right distal ureteral stone.  BMP, LFTs, and CBC show no acute findings.  At this time, the patient is stable for discharge home.  I counseled him on the results of the workup and plan of care.  I gave strict return precautions and he expressed understanding.   FINAL CLINICAL IMPRESSION(S) / ED DIAGNOSES   Final diagnoses:  Ureteral stone  Hematuria, unspecified type     Rx / DC Orders   ED Discharge Orders          Ordered    traMADol (ULTRAM) 50 MG tablet  Every 6 hours PRN        05/28/23 0954             Note:  This document was prepared using Dragon voice recognition software and may include unintentional dictation errors.    Dionne Bucy, MD 05/28/23 [phone]

## 2023-05-28 NOTE — ED Triage Notes (Signed)
C/O suprapubic pain and red/orange urine.  Onset of symptoms this morning.  Also c/o urinary urgency

## 2023-05-28 NOTE — ED Notes (Signed)
Pt transported to CT at this time.

## 2023-05-28 NOTE — Discharge Instructions (Signed)
Return to the ER for new, worsening, or persistent severe pain, bright red blood in the urine, fever, vomiting, or any other new or worsening symptoms that concern you.

## 2023-09-04 ENCOUNTER — Encounter: Payer: Self-pay | Admitting: Acute Care

## 2024-03-06 DEATH — deceased
